# Patient Record
Sex: Male | Born: 1980 | Race: White | Hispanic: No | Smoking: Never smoker
Health system: Southern US, Community
[De-identification: ages and names within clinical notes are randomized; demographics above are authoritative.]

## PROBLEM LIST (undated history)

## (undated) DIAGNOSIS — F32A Depression, unspecified: Secondary | ICD-10-CM

## (undated) DIAGNOSIS — N200 Calculus of kidney: Secondary | ICD-10-CM

## (undated) DIAGNOSIS — F329 Major depressive disorder, single episode, unspecified: Secondary | ICD-10-CM

## (undated) HISTORY — PX: SKIN GRAFT: SHX250

---
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
## 2001-04-27 ENCOUNTER — Emergency Department (HOSPITAL_COMMUNITY): Admission: EM | Admit: 2001-04-27 | Discharge: 2001-04-27 | Payer: Self-pay

## 2001-04-27 ENCOUNTER — Encounter: Payer: Self-pay | Admitting: Emergency Medicine

## 2001-10-30 ENCOUNTER — Emergency Department (HOSPITAL_COMMUNITY): Admission: EM | Admit: 2001-10-30 | Discharge: 2001-10-30 | Payer: Self-pay | Admitting: Emergency Medicine

## 2001-10-30 ENCOUNTER — Encounter: Payer: Self-pay | Admitting: Emergency Medicine

## 2002-01-02 ENCOUNTER — Emergency Department (HOSPITAL_COMMUNITY): Admission: EM | Admit: 2002-01-02 | Discharge: 2002-01-02 | Payer: Self-pay | Admitting: Emergency Medicine

## 2002-01-02 ENCOUNTER — Encounter: Payer: Self-pay | Admitting: Emergency Medicine

## 2002-03-07 ENCOUNTER — Encounter: Payer: Self-pay | Admitting: Emergency Medicine

## 2002-03-07 ENCOUNTER — Emergency Department (HOSPITAL_COMMUNITY): Admission: EM | Admit: 2002-03-07 | Discharge: 2002-03-07 | Payer: Self-pay | Admitting: Emergency Medicine

## 2002-04-18 ENCOUNTER — Emergency Department (HOSPITAL_COMMUNITY): Admission: EM | Admit: 2002-04-18 | Discharge: 2002-04-18 | Payer: Self-pay | Admitting: Emergency Medicine

## 2002-06-12 ENCOUNTER — Ambulatory Visit (HOSPITAL_COMMUNITY): Admission: RE | Admit: 2002-06-12 | Discharge: 2002-06-12 | Payer: Self-pay

## 2003-01-06 ENCOUNTER — Emergency Department (HOSPITAL_COMMUNITY): Admission: EM | Admit: 2003-01-06 | Discharge: 2003-01-06 | Payer: Self-pay | Admitting: Emergency Medicine

## 2004-07-20 ENCOUNTER — Emergency Department (HOSPITAL_COMMUNITY): Admission: EM | Admit: 2004-07-20 | Discharge: 2004-07-20 | Payer: Self-pay | Admitting: Family Medicine

## 2004-09-04 ENCOUNTER — Emergency Department (HOSPITAL_COMMUNITY): Admission: EM | Admit: 2004-09-04 | Discharge: 2004-09-04 | Payer: Self-pay | Admitting: Emergency Medicine

## 2004-11-07 ENCOUNTER — Emergency Department (HOSPITAL_COMMUNITY): Admission: EM | Admit: 2004-11-07 | Discharge: 2004-11-07 | Payer: Self-pay | Admitting: Emergency Medicine

## 2006-03-03 ENCOUNTER — Emergency Department (HOSPITAL_COMMUNITY): Admission: EM | Admit: 2006-03-03 | Discharge: 2006-03-03 | Payer: Self-pay | Admitting: Emergency Medicine

## 2006-04-28 ENCOUNTER — Emergency Department (HOSPITAL_COMMUNITY): Admission: EM | Admit: 2006-04-28 | Discharge: 2006-04-28 | Payer: Self-pay | Admitting: Emergency Medicine

## 2014-05-28 ENCOUNTER — Emergency Department (HOSPITAL_COMMUNITY)
Admission: EM | Admit: 2014-05-28 | Discharge: 2014-05-29 | Disposition: A | Discharge status: Home / Self Care | Payer: Self-pay | Attending: Emergency Medicine | Admitting: Emergency Medicine

## 2014-05-28 ENCOUNTER — Encounter (HOSPITAL_COMMUNITY): Payer: Self-pay | Admitting: Emergency Medicine

## 2014-05-28 DIAGNOSIS — H578 Other specified disorders of eye and adnexa: Secondary | ICD-10-CM | POA: Insufficient documentation

## 2014-05-28 DIAGNOSIS — R509 Fever, unspecified: Secondary | ICD-10-CM

## 2014-05-28 DIAGNOSIS — R05 Cough: Secondary | ICD-10-CM

## 2014-05-28 DIAGNOSIS — J029 Acute pharyngitis, unspecified: Secondary | ICD-10-CM | POA: Insufficient documentation

## 2014-05-28 DIAGNOSIS — R059 Cough, unspecified: Secondary | ICD-10-CM

## 2014-05-28 DIAGNOSIS — R062 Wheezing: Secondary | ICD-10-CM | POA: Insufficient documentation

## 2014-05-28 DIAGNOSIS — Z87442 Personal history of urinary calculi: Secondary | ICD-10-CM | POA: Insufficient documentation

## 2014-05-28 HISTORY — DX: Calculus of kidney: N20.0

## 2014-05-28 MED ORDER — ALBUTEROL SULFATE (2.5 MG/3ML) 0.083% IN NEBU
5.0000 mg | INHALATION_SOLUTION | Freq: Once | RESPIRATORY_TRACT | Status: AC
  Administered 2014-05-28: 5 mg via RESPIRATORY_TRACT
  Filled 2014-05-28: qty 6

## 2014-05-28 MED ORDER — ACETAMINOPHEN 325 MG PO TABS
650.0000 mg | ORAL_TABLET | Freq: Once | ORAL | Status: AC
  Administered 2014-05-28: 650 mg via ORAL

## 2014-05-28 MED ORDER — IPRATROPIUM BROMIDE 0.02 % IN SOLN
0.5000 mg | Freq: Once | RESPIRATORY_TRACT | Status: AC
  Administered 2014-05-28: 0.5 mg via RESPIRATORY_TRACT
  Filled 2014-05-28: qty 2.5

## 2014-05-28 MED ORDER — ACETAMINOPHEN 325 MG PO TABS
325.0000 mg | ORAL_TABLET | Freq: Once | ORAL | Status: DC
  Filled 2014-05-28: qty 1

## 2014-05-28 NOTE — ED Provider Notes (Signed)
CSN: [id number]     Arrival date & time 05/28/14  2237 History   None    This chart was scribed for non-physician practitioner, Zacarias Pontes PA-C working with Kalman Drape, MD by Forrestine Him, ED Scribe. This patient was seen in room TR11C/TR11C and the patient's care was started at 11:13 PM.   No chief complaint on file.  Patient is a 33 y.o. male presenting with URI. The history is provided by the patient. No language interpreter was used.  URI Presenting symptoms: congestion (nasal), cough, fatigue, fever, rhinorrhea and sore throat   Presenting symptoms: no ear pain and no facial pain   Cough:    Cough characteristics:  Dry   Sputum characteristics:  Unable to specify   Severity:  Mild   Onset quality:  Gradual   Duration:  1 day   Timing:  Rare   Progression:  Unchanged   Chronicity:  New Fatigue:    Severity:  Moderate   Duration:  1 day   Timing:  Constant Fever:    Duration:  1 day   Timing:  Constant   Max temp PTA (F):  101   Temp source:  Subjective and oral   Progression:  Improving (after tylenol) Rhinorrhea:    Quality:  Clear   Severity:  Moderate   Duration:  1 day   Timing:  Constant   Progression:  Unchanged Sore throat:    Severity:  Severe   Onset quality:  Gradual   Duration:  1 day   Timing:  Constant   Progression:  Worsening Severity:  Moderate Onset quality:  Gradual Duration:  1 day Timing:  Constant Progression:  Worsening Chronicity:  New Relieved by:  Nothing Worsened by:  Breathing Ineffective treatments:  OTC medications Associated symptoms: myalgias, sinus pain and swollen glands   Associated symptoms: no arthralgias, no headaches, no neck pain, no sneezing and no wheezing   Myalgias:    Location:  Generalized   Severity:  Moderate   Onset quality:  Gradual   Duration:  1 day   Timing:  Constant   Progression:  Unchanged Risk factors: sick contacts     HPI Comments: Zachary Mack is a 33 y.o. healthy male who  presents to the Emergency Department complaining of constant, moderate, worsening sore throat x 1 day. Pt rates pain as 8/10, located in the back of his throat, nonradiating, exacerbated when swallowing and when air hits his throat, with no known alleviating factors at this time. Pt also reports rare dry cough due to the tickle in his throat, sinus pressure, clear rhinorrhea, fatigue, watery itchy eyes, fever of 101 at its highest, neck LAD, and myalgias. He has tried OTC Tylenol with mild improvement for symptoms. Denies CP, SOB, wheezing, stridor, trismus, drooling, inability to swallow, abd pain, nausea, vomiting, ear pain/drainage, dizziness, lightheadedness, throat swelling, arthralgias, paresthesias, or neck stiffness. +Sick contacts- Son was recently admitted to hospital for possible pneumonia. No known allergies to medications.  Past Medical History  Diagnosis Date  . Kidney stone    History reviewed. No pertinent past surgical history. No family history on file. History  Substance Use Topics  . Smoking status: Never Smoker   . Smokeless tobacco: Not on file  . Alcohol Use: No    Review of Systems  Constitutional: Positive for fever and fatigue.  HENT: Positive for congestion (nasal), rhinorrhea, sinus pressure and sore throat. Negative for drooling, ear discharge, ear pain, sneezing and trouble swallowing.  Eyes: Positive for itching. Negative for pain and discharge.  Respiratory: Positive for cough. Negative for chest tightness, shortness of breath and wheezing.   Cardiovascular: Negative for chest pain.  Gastrointestinal: Negative for nausea, vomiting, abdominal pain and diarrhea.  Musculoskeletal: Positive for myalgias. Negative for arthralgias, neck pain and neck stiffness.  Skin: Negative for color change and rash.  Neurological: Negative for dizziness, syncope, weakness, light-headedness, numbness and headaches.  Hematological: Positive for adenopathy.    A complete 10  system review of systems was obtained and all systems are negative except as noted in the HPI and PMH.    Allergies  Review of patient's allergies indicates no known allergies.  Home Medications   Prior to Admission medications   Not on File   Triage Vitals: BP 129/78 mmHg  Pulse 96  Temp(Src) 101.7 F (38.7 C) (Oral)  Resp 14  Ht $R'5\' 10"'lB$  (1.778 m)  Wt 153 lb (69.4 kg)  BMI 21.95 kg/m2  SpO2 98%   Physical Exam  Constitutional: He is oriented to person, place, and time. He appears well-developed and well-nourished.  Non-toxic appearance. He appears distressed.  Febrile initially which resolved with tylenol. Otherwise vital signs stable. Patient appears to be uncomfortable, but is nontoxic appearing.  HENT:  Head: Normocephalic and atraumatic.  Right Ear: Hearing, tympanic membrane, external ear and ear canal normal.  Left Ear: Hearing, tympanic membrane, external ear and ear canal normal.  Nose: Mucosal edema and rhinorrhea present. Right sinus exhibits frontal sinus tenderness. Left sinus exhibits frontal sinus tenderness.  Mouth/Throat: Uvula is midline and mucous membranes are normal. No trismus in the jaw. No uvula swelling. Oropharyngeal exudate and posterior oropharyngeal erythema present. No tonsillar abscesses.  Ears clear bilaterally. Mild bilateral mucosal edema with clear rhinorrhea and frontal sinus tenderness to palpation. Oropharynx erythematous with 1-2+  tonsillar swelling bilaterally with mild exudates. No deviation of uvula or swelling. No drooling or trismus. Airway intact. No evidence of peritonsillar abscess.  Eyes: Conjunctivae and EOM are normal. Pupils are equal, round, and reactive to light. Right eye exhibits no discharge. Left eye exhibits no discharge.  Neck: Normal range of motion. Neck supple.  Cardiovascular: Normal rate, regular rhythm and intact distal pulses.  Exam reveals no gallop and no friction rub.   No murmur heard. Pulmonary/Chest: Effort  normal. No respiratory distress. He has no decreased breath sounds. He has wheezes in the right lower field. He has no rhonchi. He has no rales. He exhibits no tenderness.  Mild end expiratory wheeze in the R lower field which resolved with nebulizer treatment, no rhonchi or rales. Breath sounds equal bilaterally. No increased work of breathing. Oxygenation 98% on room air.  Abdominal: Soft. Normal appearance and bowel sounds are normal. He exhibits no distension. There is no tenderness. There is no rigidity, no rebound and no guarding.  Musculoskeletal: Normal range of motion.  MAE x4  Lymphadenopathy:       Head (right side): Tonsillar adenopathy present.       Head (left side): Tonsillar adenopathy present.    He has cervical adenopathy.  Superficial cervical and tonsillar lymphadenopathy bilaterally, which is tender to palpation  Neurological: He is alert and oriented to person, place, and time. He has normal strength. No sensory deficit.  Skin: Skin is warm, dry and intact. No rash noted.  Psychiatric: He has a normal mood and affect.  Nursing note and vitals reviewed.   ED Course  Procedures (including critical care time)  DIAGNOSTIC STUDIES: Oxygen  Saturation is 98% on RA, Normal by my interpretation.    COORDINATION OF CARE: 11:12 PM- Will give Tylenol, Atrovent, and Proventil. Will order DG Chest 2 view. Discussed treatment plan with pt at bedside and pt agreed to plan.     Labs Review Labs Reviewed - No data to display  Imaging Review Dg Chest 2 View  05/29/2014   CLINICAL DATA:  Cough and fever for 2 days  EXAM: CHEST  2 VIEW  COMPARISON:  09/04/2004  FINDINGS: Cardiac shadow is within normal limits. 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 The lungs are well-aerated without focal infiltrate or sizable effusion. No bony abnormality is noted.  IMPRESSION: No active cardiopulmonary disease.   Electronically Signed   By: Inez Catalina M.D.   On: 05/29/2014 00:18     EKG  Interpretation None      MDM   Final diagnoses:  Cough  Pharyngitis  Wheezing  Fever, unspecified fever cause    32y/o male with pharyngitis, fever, and body aches. CENTOR criteria met therefore will proceed with tx for strep without testing. Pt opted for bicillin shot here. Fever brought down with tylenol. Wheezing in RLL which resolved after nebs. CXR neg for PNA. Doubt PTA since tonsils are swollen equally, and pt handling secretions without difficulty. Discussed symptomatic care for URI symptoms, and use of inhaler for wheezing. No prednisone given since pt has no hx of asthma/COPD. Given rx for pain meds, but discussed tylenol/motrin as first line for fever/pain control. Pt doesn't have a PCP but his mother states she will get him in with someone in next 3-5 days. I explained the diagnosis and have given explicit precautions to return to the ER including for any other new or worsening symptoms. The patient understands and accepts the medical plan as it's been dictated and I have answered their questions. Discharge instructions concerning home care and prescriptions have been given. The patient is STABLE and is discharged to home in good condition.   I personally performed the services described in this documentation, which was scribed in my presence. The recorded information has been reviewed and is accurate.   BP 133/69 mmHg  Pulse 97  Temp(Src) 98.8 F (37.1 C) (Oral)  Resp 14  Ht $R'5\' 10"'Hm$  (1.778 m)  Wt 153 lb (69.4 kg)  BMI 21.95 kg/m2  SpO2 97%  Meds ordered this encounter  Medications  . DISCONTD: acetaminophen (TYLENOL) tablet 325 mg    Sig:   . acetaminophen (TYLENOL) tablet 650 mg    Sig:   . ipratropium (ATROVENT) nebulizer solution 0.5 mg    Sig:   . albuterol (PROVENTIL) (2.5 MG/3ML) 0.083% nebulizer solution 5 mg    Sig:   . penicillin g benzathine (BICILLIN LA) 1200000 UNIT/2ML injection 1.2 Million Units    Sig:     Order Specific Question:  Antibiotic  Indication:    Answer:  Pharyngitis  . naproxen (NAPROSYN) 500 MG tablet    Sig: Take 1 tablet (500 mg total) by mouth 2 (two) times daily as needed for mild pain, moderate pain or headache (TAKE WITH MEALS.).    Dispense:  20 tablet    Refill:  0    Order Specific Question:  Supervising Provider    Answer:  Noemi Chapel D [[id number]]  . HYDROcodone-acetaminophen (NORCO) 5-325 MG per tablet    Sig: Take 1-2 tablets by mouth every 6 (six) hours as needed for severe pain.    Dispense:  6 tablet  Refill:  0    Order Specific Question:  Supervising Provider    Answer:  Noemi Chapel D [[id number]]  . albuterol (PROVENTIL HFA;VENTOLIN HFA) 108 (90 BASE) MCG/ACT inhaler    Sig: Inhale 2 puffs into the lungs every 2 (two) hours as needed for wheezing or shortness of breath (cough).    Dispense:  1 Inhaler    Refill:  0    Order Specific Question:  Supervising Provider    Answer:  Noemi Chapel D [[id number]]  . Spacer/Aero-Holding Chambers (AEROCHAMBER PLUS WITH MASK) inhaler    Sig: Use as instructed    Dispense:  1 each    Refill:  2    Order Specific Question:  Supervising Provider    Answer:  Johnna Acosta [address] [place], PA-C 05/29/14 0201  Kalman Drape, MD 05/29/14 [phone]

## 2014-05-28 NOTE — ED Notes (Signed)
Pt. reports sore throat with occasional dry cough , fever and body aches . Airway intact / respirations unlabored .

## 2014-05-29 ENCOUNTER — Emergency Department (HOSPITAL_COMMUNITY): Payer: Self-pay

## 2014-05-29 MED ORDER — AEROCHAMBER PLUS W/MASK MISC: Status: DC

## 2014-05-29 MED ORDER — NAPROXEN 500 MG PO TABS: 500.0000 mg | ORAL_TABLET | Freq: Two times a day (BID) | ORAL | Status: DC | PRN

## 2014-05-29 MED ORDER — PENICILLIN G BENZATHINE 1200000 UNIT/2ML IM SUSP
1.2000 10*6.[IU] | Freq: Once | INTRAMUSCULAR | Status: AC
  Administered 2014-05-29: 1.2 10*6.[IU] via INTRAMUSCULAR
  Filled 2014-05-29: qty 2

## 2014-05-29 MED ORDER — HYDROCODONE-ACETAMINOPHEN 5-325 MG PO TABS: 1.0000 | ORAL_TABLET | Freq: Four times a day (QID) | ORAL | Status: DC | PRN

## 2014-05-29 MED ORDER — ALBUTEROL SULFATE HFA 108 (90 BASE) MCG/ACT IN AERS: 2.0000 | INHALATION_SPRAY | RESPIRATORY_TRACT | Status: DC | PRN

## 2014-05-29 NOTE — Discharge Instructions (Signed)
Continue to stay well-hydrated. Gargle warm salt water and spit it out. Consider chloraseptic spray or lozenges for throat pain. Continue to alternate between Tylenol and Ibuprofen for pain or fever. Use over the counter netipot and flonase to help with nasal congestion. May consider over-the-counter Benadryl or other antihistamine to decrease secretions and for watery itchy eyes. Followup with a primary care doctor in 3-5 days for recheck of ongoing symptoms, use the list below if you don't have a regular doctor. Return to emergency department for emergent changing or worsening of symptoms.  Use inhaler as directed, as needed for cough/chest congestion.   Pharyngitis Pharyngitis is redness, pain, and swelling (inflammation) of your pharynx.  CAUSES  Pharyngitis is usually caused by infection. Most of the time, these infections are from viruses (viral) and are part of a cold. However, sometimes pharyngitis is caused by bacteria (bacterial). Pharyngitis can also be caused by allergies. Viral pharyngitis may be spread from person to person by coughing, sneezing, and personal items or utensils (cups, forks, spoons, toothbrushes). Bacterial pharyngitis may be spread from person to person by more intimate contact, such as kissing.  SIGNS AND SYMPTOMS  Symptoms of pharyngitis include:   Sore throat.   Tiredness (fatigue).   Low-grade fever.   Headache.  Joint pain and muscle aches.  Skin rashes.  Swollen lymph nodes.  Plaque-like film on throat or tonsils (often seen with bacterial pharyngitis). DIAGNOSIS  Your health care provider will ask you questions about your illness and your symptoms. Your medical history, along with a physical exam, is often all that is needed to diagnose pharyngitis. Sometimes, a rapid strep test is done. Other lab tests may also be done, depending on the suspected cause.  TREATMENT  Viral pharyngitis will usually get better in 3-4 days without the use of  medicine. Bacterial pharyngitis is treated with medicines that kill germs (antibiotics).  HOME CARE INSTRUCTIONS   Drink enough water and fluids to keep your urine clear or pale yellow.   Only take over-the-counter or prescription medicines as directed by your health care provider:   If you are prescribed antibiotics, make sure you finish them even if you start to feel better.   Do not take aspirin.   Get lots of rest.   Gargle with 8 oz of salt water ( tsp of salt per 1 qt of water) as often as every 1-2 hours to soothe your throat.   Throat lozenges (if you are not at risk for choking) or sprays may be used to soothe your throat. SEEK MEDICAL CARE IF:   You have large, tender lumps in your neck.  You have a rash.  You cough up green, yellow-brown, or bloody spit. SEEK IMMEDIATE MEDICAL CARE IF:   Your neck becomes stiff.  You drool or are unable to swallow liquids.  You vomit or are unable to keep medicines or liquids down.  You have severe pain that does not go away with the use of recommended medicines.  You have trouble breathing (not caused by a stuffy nose). MAKE SURE YOU:   Understand these instructions.  Will watch your condition.  Will get help right away if you are not doing well or get worse. Document Released: 06/27/2005 Document Revised: 04/17/2013 Document Reviewed: 03/04/2013 [facility] Patient Information 2015 [place], [place]. This information is not intended to replace advice given to you by your health care provider. Make sure you discuss any questions you have with your health care provider.  Cough, Adult  A cough is a reflex. It helps you clear your throat and airways. A cough can help heal your body. A cough can last 2 or 3 weeks (acute) or may last more than 8 weeks (chronic). Some common causes of a cough can include an infection, allergy, or a cold. HOME CARE  Only take medicine as told by your doctor.  If given, take your medicines  (antibiotics) as told. Finish them even if you start to feel better.  Use a cold steam vaporizer or humidifier in your home. This can help loosen thick spit (secretions).  Sleep so you are almost sitting up (semi-upright). Use pillows to do this. This helps reduce coughing.  Rest as needed.  Stop smoking if you smoke. GET HELP RIGHT AWAY IF:  You have yellowish-white fluid (pus) in your thick spit.  Your cough gets worse.  Your medicine does not reduce coughing, and you are losing sleep.  You cough up blood.  You have trouble breathing.  Your pain gets worse and medicine does not help.  You have a fever. MAKE SURE YOU:   Understand these instructions.  Will watch your condition.  Will get help right away if you are not doing well or get worse. Document Released: 03/10/2011 Document Revised: 11/11/2013 Document Reviewed: 03/10/2011 [facility] Patient Information 2015 [place], [place]. This information is not intended to replace advice given to you by your health care provider. Make sure you discuss any questions you have with your health care provider.  How to Use an Inhaler Proper inhaler technique is very important. Good technique ensures that the medicine reaches the lungs. Poor technique results in depositing the medicine on the tongue and back of the throat rather than in the airways. If you do not use the inhaler with good technique, the medicine will not help you. STEPS TO FOLLOW IF USING AN INHALER WITHOUT AN EXTENSION TUBE  Remove the cap from the inhaler.  If you are using the inhaler for the first time, you will need to prime it. Shake the inhaler for 5 seconds and release four puffs into the air, away from your face. Ask your health care provider or pharmacist if you have questions about priming your inhaler.  Shake the inhaler for 5 seconds before each breath in (inhalation).  Position the inhaler so that the top of the canister faces up.  Put your index finger  on the top of the medicine canister. Your thumb supports the bottom of the inhaler.  Open your mouth.  Either place the inhaler between your teeth and place your lips tightly around the mouthpiece, or hold the inhaler 1-2 inches away from your open mouth. If you are unsure of which technique to use, ask your health care provider.  Breathe out (exhale) normally and as completely as possible.  Press the canister down with your index finger to release the medicine.  At the same time as the canister is pressed, inhale deeply and slowly until your lungs are completely filled. This should take 4-6 seconds. Keep your tongue down.  Hold the medicine in your lungs for 5-10 seconds (10 seconds is best). This helps the medicine get into the small airways of your lungs.  Breathe out slowly, through pursed lips. Whistling is an example of pursed lips.  Wait at least 15-30 seconds between puffs. Continue with the above steps until you have taken the number of puffs your health care provider has ordered. Do not use the inhaler more than your health care  provider tells you.  Replace the cap on the inhaler.  Follow the directions from your health care provider or the inhaler insert for cleaning the inhaler. STEPS TO FOLLOW IF USING AN INHALER WITH AN EXTENSION (SPACER)  Remove the cap from the inhaler.  If you are using the inhaler for the first time, you will need to prime it. Shake the inhaler for 5 seconds and release four puffs into the air, away from your face. Ask your health care provider or pharmacist if you have questions about priming your inhaler.  Shake the inhaler for 5 seconds before each breath in (inhalation).  Place the open end of the spacer onto the mouthpiece of the inhaler.  Position the inhaler so that the top of the canister faces up and the spacer mouthpiece faces you.  Put your index finger on the top of the medicine canister. Your thumb supports the bottom of the inhaler and  the spacer.  Breathe out (exhale) normally and as completely as possible.  Immediately after exhaling, place the spacer between your teeth and into your mouth. Close your lips tightly around the spacer.  Press the canister down with your index finger to release the medicine.  At the same time as the canister is pressed, inhale deeply and slowly until your lungs are completely filled. This should take 4-6 seconds. Keep your tongue down and out of the way.  Hold the medicine in your lungs for 5-10 seconds (10 seconds is best). This helps the medicine get into the small airways of your lungs. Exhale.  Repeat inhaling deeply through the spacer mouthpiece. Again hold that breath for up to 10 seconds (10 seconds is best). Exhale slowly. If it is difficult to take this second deep breath through the spacer, breathe normally several times through the spacer. Remove the spacer from your mouth.  Wait at least 15-30 seconds between puffs. Continue with the above steps until you have taken the number of puffs your health care provider has ordered. Do not use the inhaler more than your health care provider tells you.  Remove the spacer from the inhaler, and place the cap on the inhaler.  Follow the directions from your health care provider or the inhaler insert for cleaning the inhaler and spacer. If you are using different kinds of inhalers, use your quick relief medicine to open the airways 10-15 minutes before using a steroid if instructed to do so by your health care provider. If you are unsure which inhalers to use and the order of using them, ask your health care provider, nurse, or respiratory therapist. If you are using a steroid inhaler, always rinse your mouth with water after your last puff, then gargle and spit out the water. Do not swallow the water. AVOID:  Inhaling before or after starting the spray of medicine. It takes practice to coordinate your breathing with triggering the  spray.  Inhaling through the nose (rather than the mouth) when triggering the spray. HOW TO DETERMINE IF YOUR INHALER IS FULL OR NEARLY EMPTY You cannot know when an inhaler is empty by shaking it. A few inhalers are now being made with dose counters. Ask your health care provider for a prescription that has a dose counter if you feel you need that extra help. If your inhaler does not have a counter, ask your health care provider to help you determine the date you need to refill your inhaler. Write the refill date on a calendar or your inhaler canister.  Refill your inhaler 7-10 days before it runs out. Be sure to keep an adequate supply of medicine. This includes making sure it is not expired, and that you have a spare inhaler.  SEEK MEDICAL CARE IF:   Your symptoms are only partially relieved with your inhaler.  You are having trouble using your inhaler.  You have some increase in phlegm. SEEK IMMEDIATE MEDICAL CARE IF:   You feel little or no relief with your inhalers. You are still wheezing and are feeling shortness of breath or tightness in your chest or both.  You have dizziness, headaches, or a fast heart rate.  You have chills, fever, or night sweats.  You have a noticeable increase in phlegm production, or there is blood in the phlegm. MAKE SURE YOU:   Understand these instructions.  Will watch your condition.  Will get help right away if you are not doing well or get worse. Document Released: 06/24/2000 Document Revised: 04/17/2013 Document Reviewed: 01/24/2013 [facility] Patient Information 2015 [place]. This information is not intended to replace advice given to you by your health care provider. Make sure you discuss any questions you have with your health care provider.  Strep Throat Strep throat is an infection of the throat caused by a bacteria named Streptococcus pyogenes. Your health care provider may call the infection streptococcal "tonsillitis" or  "pharyngitis" depending on whether there are signs of inflammation in the tonsils or back of the throat. Strep throat is most common in children aged 5-15 years during the cold months of the year, but it can occur in people of any age during any season. This infection is spread from person to person (contagious) through coughing, sneezing, or other close contact. SIGNS AND SYMPTOMS   Fever or chills.  Painful, swollen, red tonsils or throat.  Pain or difficulty when swallowing.  White or yellow spots on the tonsils or throat.  Swollen, tender lymph nodes or "glands" of the neck or under the jaw.  Red rash all over the body (rare). DIAGNOSIS  Many different infections can cause the same symptoms. A test must be done to confirm the diagnosis so the right treatment can be given. A "rapid strep test" can help your health care provider make the diagnosis in a few minutes. If this test is not available, a light swab of the infected area can be used for a throat culture test. If a throat culture test is done, results are usually available in a day or two. TREATMENT  Strep throat is treated with antibiotic medicine. HOME CARE INSTRUCTIONS   Gargle with 1 tsp of salt in 1 cup of warm water, 3-4 times per day or as needed for comfort.  Family members who also have a sore throat or fever should be tested for strep throat and treated with antibiotics if they have the strep infection.  Make sure everyone in your household washes their hands well.  Do not share food, drinking cups, or personal items that could cause the infection to spread to others.  You may need to eat a soft food diet until your sore throat gets better.  Drink enough water and fluids to keep your urine clear or pale yellow. This will help prevent dehydration.  Get plenty of rest.  Stay home from school, day care, or work until you have been on antibiotics for 24 hours.  Take medicines only as directed by your health care  provider.  Take your antibiotic medicine as directed by your health  care provider. Finish it even if you start to feel better. SEEK MEDICAL CARE IF:   The glands in your neck continue to enlarge.  You develop a rash, cough, or earache.  You cough up green, yellow-brown, or bloody sputum.  You have pain or discomfort not controlled by medicines.  Your problems seem to be getting worse rather than better.  You have a fever. SEEK IMMEDIATE MEDICAL CARE IF:   You develop any new symptoms such as vomiting, severe headache, stiff or painful neck, chest pain, shortness of breath, or trouble swallowing.  You develop severe throat pain, drooling, or changes in your voice.  You develop swelling of the neck, or the skin on the neck becomes red and tender.  You develop signs of dehydration, such as fatigue, dry mouth, and decreased urination.  You become increasingly sleepy, or you cannot wake up completely. MAKE SURE YOU:  Understand these instructions.  Will watch your condition.  Will get help right away if you are not doing well or get worse. Document Released: 06/24/2000 Document Revised: 11/11/2013 Document Reviewed: 08/26/2010 [facility] Patient Information 2015 [place], [place]. This information is not intended to replace advice given to you by your health care provider. Make sure you discuss any questions you have with your health care provider.  Salt Water Gargle This solution will help make your mouth and throat feel better. HOME CARE INSTRUCTIONS   Mix 1 teaspoon of salt in 8 ounces of warm water.  Gargle with this solution as much or often as you need or as directed. Swish and gargle gently if you have any sores or wounds in your mouth.  Do not swallow this mixture. Document Released: 03/31/2004 Document Revised: 09/19/2011 Document Reviewed: 08/22/2008 [facility] Patient Information 2015 [place], [place]. This information is not intended to replace advice given to you by  your health care provider. Make sure you discuss any questions you have with your health care provider.

## 2014-05-29 NOTE — ED Notes (Signed)
Pt A&OX4, ambulatory at d/c with steady gait, NAD 

## 2014-09-23 ENCOUNTER — Encounter (HOSPITAL_COMMUNITY): Payer: Self-pay | Admitting: Emergency Medicine

## 2014-09-23 ENCOUNTER — Emergency Department (HOSPITAL_COMMUNITY): Payer: Self-pay

## 2014-09-23 ENCOUNTER — Emergency Department (HOSPITAL_COMMUNITY)
Admission: EM | Admit: 2014-09-23 | Discharge: 2014-09-23 | Disposition: A | Discharge status: Home / Self Care | Payer: Self-pay | Attending: Emergency Medicine | Admitting: Emergency Medicine

## 2014-09-23 DIAGNOSIS — Y9389 Activity, other specified: Secondary | ICD-10-CM | POA: Insufficient documentation

## 2014-09-23 DIAGNOSIS — Z79899 Other long term (current) drug therapy: Secondary | ICD-10-CM | POA: Insufficient documentation

## 2014-09-23 DIAGNOSIS — R519 Headache, unspecified: Secondary | ICD-10-CM

## 2014-09-23 DIAGNOSIS — S20312A Abrasion of left front wall of thorax, initial encounter: Secondary | ICD-10-CM | POA: Insufficient documentation

## 2014-09-23 DIAGNOSIS — S0990XA Unspecified injury of head, initial encounter: Secondary | ICD-10-CM | POA: Insufficient documentation

## 2014-09-23 DIAGNOSIS — Z791 Long term (current) use of non-steroidal anti-inflammatories (NSAID): Secondary | ICD-10-CM | POA: Insufficient documentation

## 2014-09-23 DIAGNOSIS — R0789 Other chest pain: Secondary | ICD-10-CM

## 2014-09-23 DIAGNOSIS — S20211A Contusion of right front wall of thorax, initial encounter: Secondary | ICD-10-CM | POA: Insufficient documentation

## 2014-09-23 DIAGNOSIS — Z87442 Personal history of urinary calculi: Secondary | ICD-10-CM | POA: Insufficient documentation

## 2014-09-23 DIAGNOSIS — Y998 Other external cause status: Secondary | ICD-10-CM | POA: Insufficient documentation

## 2014-09-23 DIAGNOSIS — R51 Headache: Secondary | ICD-10-CM

## 2014-09-23 DIAGNOSIS — Y92410 Unspecified street and highway as the place of occurrence of the external cause: Secondary | ICD-10-CM | POA: Insufficient documentation

## 2014-09-23 MED ORDER — METHOCARBAMOL 500 MG PO TABS: 500.0000 mg | ORAL_TABLET | Freq: Four times a day (QID) | ORAL | Status: DC

## 2014-09-23 MED ORDER — OXYCODONE-ACETAMINOPHEN 5-325 MG PO TABS: 1.0000 | ORAL_TABLET | ORAL | Status: DC | PRN
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
## 2014-09-23 NOTE — Discharge Instructions (Signed)
Take the prescribed medication as directed. °Return to the ED for new or worsening symptoms. ° °

## 2015-06-29 ENCOUNTER — Encounter (HOSPITAL_COMMUNITY): Payer: Self-pay | Admitting: *Deleted

## 2015-06-29 ENCOUNTER — Emergency Department (HOSPITAL_COMMUNITY): Payer: Self-pay

## 2015-06-29 ENCOUNTER — Emergency Department (HOSPITAL_COMMUNITY)
Admission: EM | Admit: 2015-06-29 | Discharge: 2015-06-29 | Disposition: A | Discharge status: Home / Self Care | Payer: Self-pay | Attending: Emergency Medicine | Admitting: Emergency Medicine

## 2015-06-29 DIAGNOSIS — Y998 Other external cause status: Secondary | ICD-10-CM | POA: Insufficient documentation

## 2015-06-29 DIAGNOSIS — Y92310 Basketball court as the place of occurrence of the external cause: Secondary | ICD-10-CM | POA: Insufficient documentation

## 2015-06-29 DIAGNOSIS — X509XXA Other and unspecified overexertion or strenuous movements or postures, initial encounter: Secondary | ICD-10-CM | POA: Insufficient documentation

## 2015-06-29 DIAGNOSIS — Y9367 Activity, basketball: Secondary | ICD-10-CM | POA: Insufficient documentation

## 2015-06-29 DIAGNOSIS — S99912A Unspecified injury of left ankle, initial encounter: Secondary | ICD-10-CM

## 2015-06-29 DIAGNOSIS — S9002XA Contusion of left ankle, initial encounter: Secondary | ICD-10-CM | POA: Insufficient documentation

## 2015-06-29 MED ORDER — NAPROXEN 500 MG PO TABS: 500.0000 mg | ORAL_TABLET | Freq: Two times a day (BID) | ORAL | Status: DC

## 2015-06-29 NOTE — ED Provider Notes (Signed)
CSN: [id number]     Arrival date & time 06/29/15  1906 History   First MD Initiated Contact with Patient 06/29/15 2007     Chief Complaint  Patient presents with  . Ankle Injury     (Consider location/radiation/quality/duration/timing/severity/associated sxs/prior Treatment) Patient is a 34 y.o. male presenting with lower extremity injury. The history is provided by the patient and medical records.  Ankle Injury Associated symptoms include arthralgias.     34 year old male with history of kidney stones, presenting to the ED for left ankle injury. Patient was playing basketball earlier this morning when he landed awkwardly with his left foot twisted behind his body. He denies any head injury or loss of consciousness. He complains of lateral left ankle pain and pain of dorsal left foot. Pain is exacerbated with weightbearing and ambulation. He denies any numbness or weakness of his left foot. Patient has made ambulatory, but states he is limping due to pain. No prior left ankle injuries.  He did have a skin graft of his left foot as a child.  Past Medical History  Diagnosis Date  . Kidney stone    History reviewed. No pertinent past surgical history. No family history on file. Social History  Substance Use Topics  . Smoking status: Never Smoker   . Smokeless tobacco: None  . Alcohol Use: No    Review of Systems  Musculoskeletal: Positive for arthralgias.  All other systems reviewed and are negative.     Allergies  Review of patient's allergies indicates no known allergies.  Home Medications   Prior to Admission medications   Medication Sig Start Date End Date Taking? Authorizing Provider  acetaminophen (TYLENOL) 500 MG tablet Take 1,000 mg by mouth every 4 (four) hours as needed for mild pain.  09/10/14   Historical Provider, MD  albuterol (PROVENTIL HFA;VENTOLIN HFA) 108 (90 BASE) MCG/ACT inhaler Inhale 2 puffs into the lungs every 2 (two) hours as needed for wheezing or  shortness of breath (cough). 05/29/14   Mercedes Camprubi-Soms, PA-C  HYDROcodone-acetaminophen (NORCO) 5-325 MG per tablet Take 1-2 tablets by mouth every 6 (six) hours as needed for severe pain. Patient not taking: Reported on 09/23/2014 05/29/14   Mercedes Camprubi-Soms, PA-C  ibuprofen (ADVIL,MOTRIN) 800 MG tablet Take 800 mg by mouth 3 (three) times daily. 09/10/14   Historical Provider, MD  methocarbamol (ROBAXIN) 500 MG tablet Take 1 tablet (500 mg total) by mouth 4 (four) times daily. 09/23/14   Garlon Hatchet, PA-C  naproxen (NAPROSYN) 500 MG tablet Take 1 tablet (500 mg total) by mouth 2 (two) times daily as needed for mild pain, moderate pain or headache (TAKE WITH MEALS.). Patient not taking: Reported on 09/23/2014 05/29/14   Mercedes Camprubi-Soms, PA-C  oxyCODONE-acetaminophen (PERCOCET/ROXICET) 5-325 MG per tablet Take 1 tablet by mouth every 4 (four) hours as needed. 09/23/14   Garlon Hatchet, PA-C   BP 133/89 mmHg  Pulse 70  Temp(Src) 98.3 F (36.8 C) (Oral)  Resp 18  Wt 72.576 kg  SpO2 100%   Physical Exam  Constitutional: He is oriented to person, place, and time. He appears well-developed and well-nourished. No distress.  HENT:  Head: Normocephalic and atraumatic.  Mouth/Throat: Oropharynx is clear and moist.  Eyes: Conjunctivae and EOM are normal. Pupils are equal, round, and reactive to light.  Neck: Normal range of motion. Neck supple.  Cardiovascular: Normal rate, regular rhythm and normal heart sounds.   Pulmonary/Chest: Effort normal and breath sounds normal. No respiratory distress. He has  no wheezes.  Musculoskeletal: Normal range of motion.       Left ankle: He exhibits ecchymosis. He exhibits normal range of motion, no swelling, no deformity, no laceration and normal pulse. Tenderness. Lateral malleolus tenderness found. Achilles tendon normal.       Left foot: There is tenderness and bony tenderness. There is normal range of motion, no swelling, normal capillary  refill, no crepitus, no deformity and no laceration.       Feet:  TTP of left lateral malleolus and dorsal/lateral left foot; mild bruising noted; mild swelling noted to ankle without bony deformity; full ROM maintained, pain noted with flexion of ankle; moving all toes appropriately; DP pulse intact; normal sensation throughout  Neurological: He is alert and oriented to person, place, and time.  Skin: Skin is warm and dry. He is not diaphoretic.  Psychiatric: He has a normal mood and affect.  Nursing note and vitals reviewed.   ED Course  Procedures (including critical care time) Labs Review Labs Reviewed - No data to display  Imaging Review Dg Ankle Complete Left  06/29/2015  CLINICAL DATA:  34 year old male status post injury playing basketball this morning with dorsal foot pain radiating to the lower ankle. Initial encounter. EXAM: LEFT ANKLE COMPLETE - 3+ VIEW COMPARISON:  Left foot series from today reported separately. FINDINGS: Bone mineralization is within normal limits. Mortise joint alignment preserved. Talar dome intact. No definite joint effusion. There does appear to be anterior soft tissue swelling. Calcaneus appears intact. Distal tibia and fibula appear intact. IMPRESSION: Anterior soft tissue swelling. No acute fracture or dislocation identified about the left ankle. Electronically Signed   By: Odessa FlemingH  Hall M.D.   On: 06/29/2015 20:01   Dg Foot Complete Left  06/29/2015  CLINICAL DATA:  34 year old male status post injury playing basketball this morning with dorsal foot pain radiating to the lower ankle. Initial encounter. EXAM: LEFT FOOT - COMPLETE 3+ VIEW COMPARISON:  None. FINDINGS: Bone mineralization is within normal limits. Pes planus. Calcaneus intact. Tarsal joint spaces and alignment appear preserved. Metatarsals and phalanges appear intact. There is mild blunting of the head of the second metatarsal which appears chronic, and there is probably a healed deformity at the  medial base of the second proximal phalanx. No acute fracture or dislocation identified. IMPRESSION: No acute fracture or dislocation identified about the left foot. Electronically Signed   By: Odessa FlemingH  Hall M.D.   On: 06/29/2015 20:00   I have personally reviewed and evaluated these images and lab results as part of my medical decision-making.   EKG Interpretation None      MDM   Final diagnoses:  Left ankle injury, initial encounter   34 year old male here with left ankle injury this morning while playing basketball. No bony deformities noted on exam. There is mild swelling and bruising. Left foot remains neurovascularly intact. Patient is ambulatory but with some pain. X-rays negative for acute bony findings. Patient placed in ASO brace and given crutches. Instructed to progress back to full weightbearing as tolerated. Given orthopedic follow-up if no improvement in the next week.  Rx naprosyn, RICE routine.  Discussed plan with patient, he/she acknowledged understanding and agreed with plan of care.  Return precautions given for new or worsening symptoms.  Garlon HatchetLisa M Diana Davenport, PA-C 06/29/15 2154  Lorre NickAnthony Allen, MD 06/29/15 [phone]

## 2015-06-29 NOTE — ED Notes (Signed)
Pt states that he was playing basketball this morning and landed wrong on his left foot; pt states that he rolled over his left ankle; pt c/o left ankle and top of foot pain; pt states pain is worse when weight is applied; + pulse ; + sensation

## 2015-06-29 NOTE — Discharge Instructions (Signed)
Take the prescribed medication as directed.  Ice and elevate ankle at home to help with pain/swelling. Use crutches and progress back to full weight bearing as tolerated. Follow-up with Dr. Roda ShuttersXu if no improvement within the next week. Return to the ED for new or worsening symptoms.

## 2015-09-17 ENCOUNTER — Encounter (HOSPITAL_COMMUNITY): Payer: Self-pay | Admitting: Emergency Medicine

## 2015-09-17 ENCOUNTER — Emergency Department (HOSPITAL_COMMUNITY)
Admission: EM | Admit: 2015-09-17 | Discharge: 2015-09-17 | Disposition: A | Discharge status: Home / Self Care | Payer: Self-pay | Attending: Emergency Medicine | Admitting: Emergency Medicine

## 2015-09-17 DIAGNOSIS — Z791 Long term (current) use of non-steroidal anti-inflammatories (NSAID): Secondary | ICD-10-CM | POA: Insufficient documentation

## 2015-09-17 DIAGNOSIS — Y998 Other external cause status: Secondary | ICD-10-CM | POA: Insufficient documentation

## 2015-09-17 DIAGNOSIS — S29011A Strain of muscle and tendon of front wall of thorax, initial encounter: Secondary | ICD-10-CM | POA: Insufficient documentation

## 2015-09-17 DIAGNOSIS — Y9389 Activity, other specified: Secondary | ICD-10-CM | POA: Insufficient documentation

## 2015-09-17 DIAGNOSIS — Y9289 Other specified places as the place of occurrence of the external cause: Secondary | ICD-10-CM | POA: Insufficient documentation

## 2015-09-17 DIAGNOSIS — Z87442 Personal history of urinary calculi: Secondary | ICD-10-CM | POA: Insufficient documentation

## 2015-09-17 DIAGNOSIS — X58XXXA Exposure to other specified factors, initial encounter: Secondary | ICD-10-CM | POA: Insufficient documentation

## 2015-09-17 DIAGNOSIS — Z79899 Other long term (current) drug therapy: Secondary | ICD-10-CM | POA: Insufficient documentation

## 2015-09-17 MED ORDER — MELOXICAM 7.5 MG PO TABS: 15.0000 mg | ORAL_TABLET | Freq: Every day | ORAL | Status: DC

## 2015-09-17 NOTE — Discharge Instructions (Signed)
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
## 2015-12-07 ENCOUNTER — Emergency Department (HOSPITAL_COMMUNITY)
Admission: EM | Admit: 2015-12-07 | Discharge: 2015-12-07 | Disposition: A | Discharge status: Home / Self Care | Payer: No Typology Code available for payment source | Attending: Emergency Medicine | Admitting: Emergency Medicine

## 2015-12-07 ENCOUNTER — Encounter (HOSPITAL_COMMUNITY): Payer: Self-pay | Admitting: Emergency Medicine

## 2015-12-07 DIAGNOSIS — Z791 Long term (current) use of non-steroidal anti-inflammatories (NSAID): Secondary | ICD-10-CM | POA: Insufficient documentation

## 2015-12-07 DIAGNOSIS — G8929 Other chronic pain: Secondary | ICD-10-CM | POA: Insufficient documentation

## 2015-12-07 DIAGNOSIS — K0889 Other specified disorders of teeth and supporting structures: Secondary | ICD-10-CM | POA: Insufficient documentation

## 2015-12-07 DIAGNOSIS — Z79891 Long term (current) use of opiate analgesic: Secondary | ICD-10-CM | POA: Insufficient documentation

## 2015-12-07 DIAGNOSIS — Z79899 Other long term (current) drug therapy: Secondary | ICD-10-CM | POA: Insufficient documentation

## 2015-12-07 MED ORDER — IBUPROFEN 800 MG PO TABS
800.0000 mg | ORAL_TABLET | Freq: Once | ORAL | Status: AC
  Administered 2015-12-07: 800 mg via ORAL
  Filled 2015-12-07: qty 1

## 2015-12-07 MED ORDER — PENICILLIN V POTASSIUM 500 MG PO TABS
500.0000 mg | ORAL_TABLET | Freq: Once | ORAL | Status: AC
  Administered 2015-12-07: 500 mg via ORAL
  Filled 2015-12-07: qty 1

## 2015-12-07 MED ORDER — BUPIVACAINE-EPINEPHRINE (PF) 0.5% -1:200000 IJ SOLN
1.8000 mL | Freq: Once | INTRAMUSCULAR | Status: DC
  Filled 2015-12-07: qty 1.8

## 2015-12-07 MED ORDER — PENICILLIN V POTASSIUM 500 MG PO TABS: 500.0000 mg | ORAL_TABLET | Freq: Four times a day (QID) | ORAL | Status: AC

## 2015-12-07 NOTE — ED Notes (Signed)
pa at bedside. 

## 2015-12-07 NOTE — ED Notes (Signed)
Pt c/o worsening of ongoing dental pain onset this morning, states when he woke up he had 3 dental fragments in his mouth from where his left lower molar fractured. Left lower proximal molar fractured with jagged edges.

## 2015-12-07 NOTE — ED Provider Notes (Signed)
CSN: [id number]     Arrival date & time 12/07/15  1157 History  By signing my name below, I, Soijett Blue, attest that this documentation has been prepared under the direction and in the presence of S. Lane Hacker, PA-C Electronically Signed: Soijett Blue, ED Scribe. 12/07/2015. 1:38 PM.    Chief Complaint  Patient presents with  . Dental Pain      The history is provided by the patient. No language interpreter was used.    Zachary Mack is a 35 y.o. male who presents to the Emergency Department complaining of chronic left lower dental pain worsening this morning. Pt reports that he woke up this morning with dental fragments in his mouth. Pt notes that his back wisdom tooth has been broken for awhile without any pain to the area until this morning. He states that has not tried any medications for the relief for his symptoms. He denies fever, chills, and any other symptoms. Pt denies allergies to any medications.    Past Medical History  Diagnosis Date  . Kidney stone    History reviewed. No pertinent past surgical history. History reviewed. No pertinent family history. Social History  Substance Use Topics  . Smoking status: Never Smoker   . Smokeless tobacco: None  . Alcohol Use: No    Review of Systems  A complete 10 system review of systems was obtained and all systems are negative except as noted in the HPI and PMH.   Allergies  Review of patient's allergies indicates no known allergies.  Home Medications   Prior to Admission medications   Medication Sig Start Date End Date Taking? Authorizing Provider  acetaminophen (TYLENOL) 500 MG tablet Take 1,000 mg by mouth every 4 (four) hours as needed for mild pain.  09/10/14   Historical Provider, MD  albuterol (PROVENTIL HFA;VENTOLIN HFA) 108 (90 BASE) MCG/ACT inhaler Inhale 2 puffs into the lungs every 2 (two) hours as needed for wheezing or shortness of breath (cough). 05/29/14   Mercedes Camprubi-Soms, PA-C   HYDROcodone-acetaminophen (NORCO) 5-325 MG per tablet Take 1-2 tablets by mouth every 6 (six) hours as needed for severe pain. Patient not taking: Reported on 09/23/2014 05/29/14   Mercedes Camprubi-Soms, PA-C  ibuprofen (ADVIL,MOTRIN) 800 MG tablet Take 800 mg by mouth 3 (three) times daily. 09/10/14   Historical Provider, MD  meloxicam (MOBIC) 7.5 MG tablet Take 2 tablets (15 mg total) by mouth daily. 09/17/15   Antony Madura, PA-C  methocarbamol (ROBAXIN) 500 MG tablet Take 1 tablet (500 mg total) by mouth 4 (four) times daily. 09/23/14   Garlon Hatchet, PA-C  naproxen (NAPROSYN) 500 MG tablet Take 1 tablet (500 mg total) by mouth 2 (two) times daily with a meal. 06/29/15   Garlon Hatchet, PA-C  oxyCODONE-acetaminophen (PERCOCET/ROXICET) 5-325 MG per tablet Take 1 tablet by mouth every 4 (four) hours as needed. 09/23/14   Garlon Hatchet, PA-C   BP 115/82 mmHg  Pulse 65  Temp(Src) 97.7 F (36.5 C) (Oral)  Resp 16  SpO2 96% Physical Exam  Constitutional: He is oriented to person, place, and time. He appears well-developed and well-nourished. No distress.  HENT:  Head: Normocephalic and atraumatic.  Mouth/Throat: Uvula is midline, oropharynx is clear and moist and mucous membranes are normal.  Broken tooth posterior bottom left molar. No erythema. No fluctuant area.   Eyes: EOM are normal.  Neck: Neck supple.  Cardiovascular: Normal rate.   Pulmonary/Chest: Effort normal. No respiratory distress.  Abdominal: He exhibits  no distension.  Musculoskeletal: Normal range of motion.  Neurological: He is alert and oriented to person, place, and time.  Skin: Skin is warm and dry.  Psychiatric: He has a normal mood and affect. His behavior is normal.  Nursing note and vitals reviewed.   ED Course  .Nerve Block Date/Time: 12/07/2015 9:48 PM Performed by: Althea GrimmerILEY, Shalini Mair NICOLE Authorized by: Melton KrebsILEY, Briyana Badman NICOLE Consent: Verbal consent obtained. Risks and benefits: risks, benefits and  alternatives were discussed Consent given by: patient Patient identity confirmed: verbally with patient and arm band Indications: pain relief Body area: face/mouth Nerve: inferior alveolar Laterality: left Local anesthetic: bupivacaine 0.5% with epinephrine Outcome: pain improved Patient tolerance: Patient tolerated the procedure well with no immediate complications    DIAGNOSTIC STUDIES: Oxygen Saturation is 96% on RA, nl by my interpretation.    COORDINATION OF CARE: 1:38 PM Discussed treatment plan with pt at bedside which includes dental block, ibuprofen Rx, and penicillin Rx and pt agreed to plan.   MDM   Final diagnoses:  Pain, dental    Patient with dentalgia.  No abscess requiring immediate incision and drainage.  Exam not concerning for Ludwig's angina or pharyngeal abscess.  Will treat with dental block in the ED, ibuprofen Rx, and penicillin Rx. Pt instructed to follow-up with dentist.  Discussed return precautions. Pt safe for discharge.   I personally performed the services described in this documentation, which was scribed in my presence. The recorded information has been reviewed and is accurate.    Melton KrebsSamantha Nicole Rambo Sarafian, PA-C 12/07/15 2149  Tilden FossaElizabeth Rees, MD 12/09/15 [phone]

## 2015-12-07 NOTE — Discharge Instructions (Signed)
[name],  Nice meeting you! Please follow-up with your dentist. If you do not have one, please see attached info. Return to the emergency department if you develop fevers, chills, increased pain, new/worsening symptoms. Feel better soon!  S. Lane HackerNicole Khiree Bukhari, PA-C  [employer] [facility] [employer] 211 is a great source of information about community services available.  Access by dialing 2-1-1 from anywhere in [place] , or by website -  PooledIncome.plwww.nc211.org.   Other Local Resources (Updated 07/2015)  Dental  Care   Services    Phone Number and Address  Cost  [place] [facility] For children 530 - 34 years of age:   Cleaning  Tooth brushing/flossing instruction  Sealants, fillings, crowns  Extractions  Emergency treatment  [phone] [address] [place], [place] [address] Charges based on family income.  Medicaid and some insurance plans accepted.     [facility]  Cleaning  Sealants, fillings, crowns  Extractions  Emergency treatment [phone] [address] [place], [place]  Pregnant women 34 years of age or older with a Medicaid card  [facility] [place]  Cleaning  Sealants, fillings, crowns  Extractions  Emergency treatment [phone] [address] [place], [place] Pregnant women 34 years of age or older with a Medicaid card  [facility] For children 90 - 34 years of age:   Cleaning  Tooth brushing/flossing instruction  Sealants, fillings, crowns  Extractions  Emergency treatment Limited orthodontic services for patients with Medicaid [phone] [address] [place], [place] [address] Medicaid and [facility] [place] Health Choice cover for children up to age 34 and pregnant women.  Parents of children up to age 34 without Medicaid pay a reduced fee at time of  service.  [facility] [place] For children 290 - 34 years of age:   Cleaning  Tooth brushing/flossing instruction  Sealants, fillings, crowns  Extractions  Emergency treatment Limited orthodontic services for patients with Medicaid [phone] [address] [place], [place].  Medicaid and [place] Health Choice cover for children up to age 34 and pregnant women.  Parents of children up to age 34 without Medicaid pay a reduced fee.  [facility]  Cleaning  Sealants, fillings, crowns  Extractions  Hours: Tuesdays and Thursdays, 4:15 - 8 pm [phone] [address], [address] [place], [place] [address] Services free of charge to [facility] residents ages 18-64 who do not have health insurance, Medicare, [place], or [place] benefits and fall within federal poverty guidelines  [facility]    Provides dental care in addition to primary medical care, nutritional counseling, and pharmacy:  Nurse, mental healthCleaning  Sealants, fillings, crowns  Extractions                  [phone] [facility], [address] [place], [place]  [phone] Ph[facility]as Real[facility]les [facility], [address] [place], [place]  [phone] [facility] [place], [place]  [phone] [facility], [address] [place], [place]  [phone] [facility] [address] [place], [place] Accepts [place], [place]are, most insurance.  Also provides services available to all with fees adjusted based on ability to pay.    [facility]  Cleaning  Tooth brushing/flossing instruction  Sealants, fillings,  crowns  Extractions  Emergency treatment Hours: Tuesdays, Thursdays, and Fridays from 8 am to 5 pm by appointment only. [phone] [address] [place], [place] [address] [facility]  residents with Medicaid (depending on eligibility) and children with [facility] Health Choice - call for more information.  [facility]  Extractions only  Hours: 2nd and 4th Thursday of each month from 6:30 am - 9 am.   [phone] [place], [place] [address] Ages 22 and older only.  Patients are seen on a first come, first served basis.  [employer]  [employer]  Extractions  Orthodontics  Endodontics  Implants/Crowns/Bridges  Complete and partial dentures [phone] [employer],  Patients must complete an application for services.  There is often a waiting list.

## 2016-01-02 ENCOUNTER — Encounter (HOSPITAL_COMMUNITY): Payer: Self-pay | Admitting: Emergency Medicine

## 2016-01-02 ENCOUNTER — Emergency Department (HOSPITAL_COMMUNITY)
Admission: EM | Admit: 2016-01-02 | Discharge: 2016-01-02 | Disposition: A | Discharge status: Home / Self Care | Payer: No Typology Code available for payment source | Attending: Emergency Medicine | Admitting: Emergency Medicine

## 2016-01-02 DIAGNOSIS — W231XXA Caught, crushed, jammed, or pinched between stationary objects, initial encounter: Secondary | ICD-10-CM | POA: Insufficient documentation

## 2016-01-02 DIAGNOSIS — S90211A Contusion of right great toe with damage to nail, initial encounter: Secondary | ICD-10-CM | POA: Insufficient documentation

## 2016-01-02 DIAGNOSIS — Y929 Unspecified place or not applicable: Secondary | ICD-10-CM | POA: Insufficient documentation

## 2016-01-02 DIAGNOSIS — Y998 Other external cause status: Secondary | ICD-10-CM | POA: Insufficient documentation

## 2016-01-02 DIAGNOSIS — Y9367 Activity, basketball: Secondary | ICD-10-CM | POA: Insufficient documentation

## 2016-01-02 NOTE — Discharge Instructions (Signed)
Try to clip you are toenail as short as possible, your injury this morning .  Lifted part of the nail away from the nail bed  it's best to leave the nail in place as it acts a natural bandage.

## 2016-01-02 NOTE — ED Provider Notes (Signed)
CSN: [id number]     Arrival date & time 01/02/16  1700 History  By signing my name below, I, Ronney LionSuzanne Le, attest that this documentation has been prepared under the direction and in the presence of Earley FavorGail Caridad Silveira, NP. Electronically Signed: Ronney LionSuzanne Le, ED Scribe. 01/02/2016. 5:49 PM.    Chief Complaint  Patient presents with  . Toe Pain   The history is provided by the patient. No language interpreter was used.    HPI Comments: Zachary Mack is a 35 y.o. male who presents to the Emergency Department complaining of constant, aching, right great toe pain that began 2 weeks ago after stubbing his toe, which acutely worsened this morning after stubbing his toe again while playing basketball. Patient also reports his nail appears to "not be attached anymore" and appeared to be bruised underneath the nail since re-injuring his nail this morning. He reports there was no bleeding when he initially stubbed his toe 2 weeks ago, but he did experience immediate, throbbing pain at that time. He states he felt unable to stand for long periods of time today secondary to his pain.      Past Medical History  Diagnosis Date  . Kidney stone    History reviewed. No pertinent past surgical history. No family history on file. Social History  Substance Use Topics  . Smoking status: Never Smoker   . Smokeless tobacco: None  . Alcohol Use: No    Review of Systems  Musculoskeletal: Positive for arthralgias (right great toe pain).  Skin: Positive for color change (bruising to right great toenail).  All other systems reviewed and are negative.     Allergies  Review of patient's allergies indicates no known allergies.  Home Medications   Prior to Admission medications   Medication Sig Start Date End Date Taking? Authorizing Provider  acetaminophen (TYLENOL) 500 MG tablet Take 1,000 mg by mouth every 4 (four) hours as needed for mild pain.  09/10/14   Historical Provider, MD  albuterol (PROVENTIL  HFA;VENTOLIN HFA) 108 (90 BASE) MCG/ACT inhaler Inhale 2 puffs into the lungs every 2 (two) hours as needed for wheezing or shortness of breath (cough). 05/29/14   Mercedes Camprubi-Soms, PA-C  HYDROcodone-acetaminophen (NORCO) 5-325 MG per tablet Take 1-2 tablets by mouth every 6 (six) hours as needed for severe pain. Patient not taking: Reported on 09/23/2014 05/29/14   Mercedes Camprubi-Soms, PA-C  ibuprofen (ADVIL,MOTRIN) 800 MG tablet Take 800 mg by mouth 3 (three) times daily. 09/10/14   Historical Provider, MD  meloxicam (MOBIC) 7.5 MG tablet Take 2 tablets (15 mg total) by mouth daily. 09/17/15   Antony MaduraKelly Humes, PA-C  methocarbamol (ROBAXIN) 500 MG tablet Take 1 tablet (500 mg total) by mouth 4 (four) times daily. 09/23/14   Garlon HatchetLisa M Sanders, PA-C  naproxen (NAPROSYN) 500 MG tablet Take 1 tablet (500 mg total) by mouth 2 (two) times daily with a meal. 06/29/15   Garlon HatchetLisa M Sanders, PA-C  oxyCODONE-acetaminophen (PERCOCET/ROXICET) 5-325 MG per tablet Take 1 tablet by mouth every 4 (four) hours as needed. 09/23/14   Garlon HatchetLisa M Sanders, PA-C   BP 105/89 mmHg  Pulse 91  Temp(Src) 98.2 F (36.8 C) (Oral)  Resp 16  SpO2 98% Physical Exam  Constitutional: He is oriented to person, place, and time. He appears well-developed and well-nourished. No distress.  HENT:  Head: Normocephalic and atraumatic.  Eyes: Conjunctivae and EOM are normal.  Neck: Neck supple. No tracheal deviation present.  Cardiovascular: Normal rate.   Pulmonary/Chest: Effort  normal. No respiratory distress.  Musculoskeletal: Normal range of motion.  Neurological: He is alert and oriented to person, place, and time.  Skin: Skin is warm and dry.  Psychiatric: He has a normal mood and affect. His behavior is normal.  Nursing note and vitals reviewed.   ED Course  Procedures (including critical care time)  DIAGNOSTIC STUDIES: Oxygen Saturation is 98% on RA, normal by my interpretation.    COORDINATION OF CARE: 5:37 PM - Discussed  treatment plan with pt at bedside which includes ibuprofen prn for pain relief. Advised pt to trim toenail to avoid re-injuring it, as well as to tape the toenail while playing basketball. Pt verbalized understanding and agreed to plan.   MDM   Final diagnoses:  Subungual hematoma of great toe of right foot, initial encounter   I personally performed the services described in this documentation, which was scribed in my presence. The recorded information has been reviewed and is accurate.    Earley FavorGail Devera Englander, NP 01/20/16 2027  Lyndal Pulleyaniel Knott, MD 01/21/16 [phone]

## 2016-01-02 NOTE — ED Notes (Signed)
Pt c/o right sided toe pain. Pt states he hurt his toe nail a few weeks ago but this morning jammed it during basketball. Bruise noted underneath toenail. Pt can still move toe and has sensation.

## 2017-04-23 ENCOUNTER — Emergency Department (HOSPITAL_COMMUNITY): Payer: Self-pay

## 2017-04-23 ENCOUNTER — Emergency Department (HOSPITAL_COMMUNITY)
Admission: EM | Admit: 2017-04-23 | Discharge: 2017-04-23 | Disposition: A | Discharge status: Home / Self Care | Payer: Self-pay | Attending: Emergency Medicine | Admitting: Emergency Medicine

## 2017-04-23 ENCOUNTER — Encounter (HOSPITAL_COMMUNITY): Payer: Self-pay

## 2017-04-23 DIAGNOSIS — Z79899 Other long term (current) drug therapy: Secondary | ICD-10-CM | POA: Insufficient documentation

## 2017-04-23 DIAGNOSIS — Y929 Unspecified place or not applicable: Secondary | ICD-10-CM | POA: Insufficient documentation

## 2017-04-23 DIAGNOSIS — Y999 Unspecified external cause status: Secondary | ICD-10-CM | POA: Insufficient documentation

## 2017-04-23 DIAGNOSIS — X58XXXA Exposure to other specified factors, initial encounter: Secondary | ICD-10-CM | POA: Insufficient documentation

## 2017-04-23 DIAGNOSIS — Y939 Activity, unspecified: Secondary | ICD-10-CM | POA: Insufficient documentation

## 2017-04-23 DIAGNOSIS — S93602A Unspecified sprain of left foot, initial encounter: Secondary | ICD-10-CM | POA: Insufficient documentation

## 2017-04-23 HISTORY — DX: Depression, unspecified: F32.A

## 2017-04-23 HISTORY — DX: Major depressive disorder, single episode, unspecified: F32.9

## 2017-04-23 MED ORDER — DICLOFENAC SODIUM 50 MG PO TBEC: 50.0000 mg | DELAYED_RELEASE_TABLET | Freq: Two times a day (BID) | ORAL | 0 refills | Status: DC

## 2017-04-23 NOTE — ED Notes (Signed)
Pt declined need for ice pack.

## 2017-04-23 NOTE — ED Triage Notes (Signed)
Patient states he has had pain in the left foot x 6 months. Yesterday, he stepped off of a ladder and the pain is worse.

## 2017-04-23 NOTE — ED Provider Notes (Signed)
[facility]-EMERGENCY DEPT Provider Note   CSN: [id number] Arrival date & time: 04/23/17  1049     History   Chief Complaint Chief Complaint  Patient presents with  . Foot Pain    HPI Zachary Mack is a 36 y.o. male who presents to the ED with foot pain. The pain is located in the left foot. Patient reports having had the pain for 6 months but yesterday he stepped off a ladder and the pain got worse. Patient reports that he works 2 jobs so he is one place on his feet all day and then goes to his second job where he continues to on his feet.   HPI  Past Medical History:  Diagnosis Date  . Depression   . Kidney stone     There are no active problems to display for this patient.   History reviewed. No pertinent surgical history.     Home Medications    Prior to Admission medications   Medication Sig Start Date End Date Taking? Authorizing Provider  acetaminophen (TYLENOL) 500 MG tablet Take 1,000 mg by mouth every 4 (four) hours as needed for mild pain.  09/10/14   [provider]  albuterol (PROVENTIL HFA;VENTOLIN HFA) 108 (90 BASE) MCG/ACT inhaler Inhale 2 puffs into the lungs every 2 (two) hours as needed for wheezing or shortness of breath (cough). 05/29/14   Street, [place], PA-C  diclofenac (VOLTAREN) 50 MG EC tablet Take 1 tablet (50 mg total) by mouth 2 (two) times daily. 04/23/17   Janne Napoleon, NP  meloxicam (MOBIC) 7.5 MG tablet Take 2 tablets (15 mg total) by mouth daily. 09/17/15   Antony Madura, PA-C  methocarbamol (ROBAXIN) 500 MG tablet Take 1 tablet (500 mg total) by mouth 4 (four) times daily. 09/23/14   Garlon Hatchet, PA-C    Family History Family History  Problem Relation Age of Onset  . Cancer Father     Social History Social History  Substance Use Topics  . Smoking status: Never Smoker  . Smokeless tobacco: Never Used  . Alcohol use No     Allergies   Patient has no known allergies.   Review of Systems Review of Systems    Musculoskeletal: Positive for arthralgias.       Left foot pain  Skin: Negative for wound.  All other systems reviewed and are negative.    Physical Exam Updated Vital Signs BP 103/83   Pulse 60   Temp 98 F (36.7 C) (Oral)   Resp 16   Ht  (1.778 m)   Wt 74.8 kg (165 lb)   SpO2 95%   BMI 23.68 kg/m   Physical Exam  Constitutional: He appears well-developed and well-nourished. No distress.  HENT:  Head: Normocephalic.  Eyes: EOM are normal.  Neck: Neck supple.  Cardiovascular: Normal rate.   Pulmonary/Chest: Effort normal.  Musculoskeletal:       Left foot: There is tenderness. There is normal range of motion, no swelling, normal capillary refill, no crepitus, no deformity and no laceration.  Pedal pulses 2+, adequate circulation. Pain with palpation of the lateral aspect of the left foot that radiates to the ankle.   Neurological: He is alert.  Skin: Skin is warm and dry.  Nursing note and vitals reviewed.    ED Treatments / Results  Labs (all labs ordered are listed, but only abnormal results are displayed) Labs Reviewed - No data to display   Radiology Dg Foot Complete Left  Result  Date: 04/23/2017 CLINICAL DATA:  Left foot pain for 3 days. EXAM: LEFT FOOT - COMPLETE 3+ VIEW COMPARISON:  None. FINDINGS: There is no evidence of fracture or dislocation. There is no evidence of arthropathy or other focal bone abnormality. Soft tissues are unremarkable. IMPRESSION: Negative. Electronically Signed   By: Ted Mcalpine M.D.   On: 04/23/2017 12:20    Procedures Procedures (including critical care time)  Medications Ordered in ED Medications - No data to display   Initial Impression / Assessment and Plan / ED Course  I have reviewed the triage vital signs and the nursing notes.  36 y.o. male with left foot pain that radiates to the ankle stable for d/c without fracture or dislocation noted on x-ray and no focal neuro deficits. ASO applied, NSAIDS and  f/u with ortho if symptoms persist. Patient agrees with plan.  Final Clinical Impressions(s) / ED Diagnoses   Final diagnoses:  Foot sprain, left, initial encounter    New Prescriptions New Prescriptions   DICLOFENAC (VOLTAREN) 50 MG EC TABLET    Take 1 tablet (50 mg total) by mouth 2 (two) times daily.     Kerrie Buffalo [place], [place] 04/23/17 1335    Raeford Razor, MD 04/24/17 1246

## 2017-05-16 ENCOUNTER — Emergency Department (HOSPITAL_COMMUNITY): Payer: Self-pay

## 2017-05-16 ENCOUNTER — Emergency Department (HOSPITAL_COMMUNITY)
Admission: EM | Admit: 2017-05-16 | Discharge: 2017-05-16 | Disposition: A | Discharge status: Home / Self Care | Payer: Self-pay | Attending: Emergency Medicine | Admitting: Emergency Medicine

## 2017-05-16 DIAGNOSIS — M25521 Pain in right elbow: Secondary | ICD-10-CM | POA: Insufficient documentation

## 2017-05-16 NOTE — Discharge Instructions (Signed)
Alternate 600 mg of ibuprofen and [id number] mg of Tylenol every 3 hours as needed for pain. Do not exceed 4000 mg of Tylenol daily.  Apply ice or heat for comfort.  Avoid heavy lifting or repetitive motion.  Follow-up with a primary care physician or orthopedic physician if symptoms persist greater than 1 week.  Return to the ED immediately for any concerning signs or symptoms develop such as fevers, severe worsening of pain, swelling, redness, or weakness.

## 2017-05-16 NOTE — ED Triage Notes (Signed)
Patient fell from a ladder injuring right elbow yesterday. Patient states that as he fell his elbow hit the body of a chain saw. +swelling/bruising and limited ROM in affected extremity. Pain increases with movement, no pain at all with rest.

## 2017-05-16 NOTE — ED Provider Notes (Signed)
[facility]-EMERGENCY DEPT Provider Note   CSN: [id number] Arrival date & time: 05/16/17  1216     History   Chief Complaint Chief Complaint  Patient presents with  . Joint Swelling    HPI Zachary Mack is a 36 y.o. male who presents today with chief complaint acute onset, intermittent right elbow pain since yesterday.  He states that he was on a ladder attempting to saw off a tree branch when he slipped from a low height and fell backwards.  The chainsaw hit his right elbow.  He denies head injury or loss of consciousness.  He endorses swelling and bruising to the medial aspect of the right elbow.  He has no pain with his elbow extended but pain occurs during range of motion of the elbow.  The pain worsens with palpation.  He endorses intermittent numbness and tingling of his right fourth and fifth digits.  Denies weakness.  He has tried ibuprofen and ice with improvement in his symptoms.  Denies headaches or neck pain.  He is right-hand dominant.  The history is provided by the patient.    Past Medical History:  Diagnosis Date  . Depression   . Kidney stone     There are no active problems to display for this patient.   No past surgical history on file.     Home Medications    Prior to Admission medications   Medication Sig Start Date End Date Taking? Authorizing Provider  acetaminophen (TYLENOL) 500 MG tablet Take 1,000 mg by mouth every 4 (four) hours as needed for mild pain.  09/10/14   [provider]  albuterol (PROVENTIL HFA;VENTOLIN HFA) 108 (90 BASE) MCG/ACT inhaler Inhale 2 puffs into the lungs every 2 (two) hours as needed for wheezing or shortness of breath (cough). 05/29/14   Street, [place], PA-C  diclofenac (VOLTAREN) 50 MG EC tablet Take 1 tablet (50 mg total) by mouth 2 (two) times daily. 04/23/17   Janne NapoleonNeese, Hope M, NP  meloxicam (MOBIC) 7.5 MG tablet Take 2 tablets (15 mg total) by mouth daily. 09/17/15   Antony MaduraHumes, Kelly, PA-C    methocarbamol (ROBAXIN) 500 MG tablet Take 1 tablet (500 mg total) by mouth 4 (four) times daily. 09/23/14   Garlon HatchetSanders, Lisa M, PA-C    Family History Family History  Problem Relation Age of Onset  . Cancer Father     Social History Social History   Tobacco Use  . Smoking status: Never Smoker  . Smokeless tobacco: Never Used  Substance Use Topics  . Alcohol use: No  . Drug use: Yes    Types: Marijuana     Allergies   Patient has no known allergies.   Review of Systems Review of Systems  Constitutional: Negative for chills and fever.  Eyes: Negative for visual disturbance.  Musculoskeletal: Positive for arthralgias.  Skin: Positive for color change.  Neurological: Positive for numbness. Negative for syncope, weakness and headaches.     Physical Exam Updated Vital Signs BP 120/81 (BP Location: Right Arm)   Pulse 71   Temp 98.2 F (36.8 C) (Oral)   Resp 16   Ht 5\' 10"  (1.778 m)   Wt 72.6 kg (160 lb)   SpO2 98%   BMI 22.96 kg/m   Physical Exam  Constitutional: He appears well-developed and well-nourished. No distress.  HENT:  Head: Normocephalic and atraumatic.  Eyes: Conjunctivae and EOM are normal. Pupils are equal, round, and reactive to light. Right eye exhibits no discharge.  Left eye exhibits no discharge.  Neck: Normal range of motion. Neck supple. No JVD present. No tracheal deviation present.  Cardiovascular: Normal rate and intact distal pulses.  2+ radial pulses bilaterally  Pulmonary/Chest: Effort normal.  Abdominal: He exhibits no distension.  Musculoskeletal: He exhibits tenderness. He exhibits no edema.       Right elbow: He exhibits decreased range of motion. Tenderness found. Medial epicondyle tenderness noted. No radial head, no lateral epicondyle and no olecranon process tenderness noted.  3 cm area of ecchymosis and swelling and tenderness to the right elbow near the medial epicondyle.  No ligamentous laxity noted.  Decreased range of motion  with flexion secondary to pain.  5/5 strength of the BUE major muscle groups.  Normal examination of the joints of the right upper extremity otherwise.  No crepitus noted.  Neurological: He is alert.  Fluent speech, no facial droop, sensation intact to soft touch of bilateral upper extremities  Skin: Skin is warm and dry. No erythema.  Psychiatric: He has a normal mood and affect. His behavior is normal.  Nursing note and vitals reviewed.    ED Treatments / Results  Labs (all labs ordered are listed, but only abnormal results are displayed) Labs Reviewed - No data to display  EKG  EKG Interpretation None       Radiology Dg Elbow Complete Right  Result Date: 05/16/2017 CLINICAL DATA:  36 y/o  M; fall with pain. EXAM: RIGHT ELBOW - COMPLETE 3+ VIEW COMPARISON:  None. FINDINGS: There is no evidence of fracture, dislocation, or joint effusion. There is no evidence of arthropathy or other focal bone abnormality. Soft tissues are unremarkable. IMPRESSION: Negative. Electronically Signed   By: Mitzi HansenLance  Furusawa-Stratton M.D.   On: 05/16/2017 14:15    Procedures Procedures (including critical care time)  Medications Ordered in ED Medications - No data to display   Initial Impression / Assessment and Plan / ED Course  I have reviewed the triage vital signs and the nursing notes.  Pertinent labs & imaging results that were available during my care of the patient were reviewed by me and considered in my medical decision making (see chart for details).     Patient with right elbow pain secondary to injury yesterday.  Ecchymosis noted.  Afebrile, vital signs are stable, he is neurovascularly intact.  Denies head injury or loss of consciousness.  Radiographs of the elbow show no fracture or dislocation.  RICE Therapy indicated and discussed.  Patient will be discharged with instructions for NSAIDs, Tylenol, ice, heat, and follow-up with orthopedic physician if symptoms persist.  Discussed  indications for return to the ED. Pt verbalized understanding of and agreement with plan and is safe for discharge home at this time.  No complaints prior to discharge.  Final Clinical Impressions(s) / ED Diagnoses   Final diagnoses:  Right elbow pain    ED Discharge Orders    None       Bennye AlmFawze, Hawkin Charo A, PA-C 05/16/17 1444    Azalia Bilisampos, Kevin, MD 05/17/17 2040

## 2017-12-14 ENCOUNTER — Emergency Department (HOSPITAL_COMMUNITY): Admission: EM | Admit: 2017-12-14 | Discharge: 2017-12-14 | Discharge status: Against Medical Advice | Payer: Self-pay

## 2017-12-14 NOTE — ED Notes (Signed)
Pt called for triage, no response from lobby 

## 2018-09-27 ENCOUNTER — Emergency Department (HOSPITAL_COMMUNITY)
Admission: EM | Admit: 2018-09-27 | Discharge: 2018-09-27 | Disposition: A | Discharge status: Home / Self Care | Payer: Self-pay | Attending: Emergency Medicine | Admitting: Emergency Medicine

## 2018-09-27 ENCOUNTER — Encounter (HOSPITAL_COMMUNITY): Payer: Self-pay

## 2018-09-27 ENCOUNTER — Other Ambulatory Visit: Payer: Self-pay

## 2018-09-27 DIAGNOSIS — S0591XA Unspecified injury of right eye and orbit, initial encounter: Secondary | ICD-10-CM | POA: Insufficient documentation

## 2018-09-27 DIAGNOSIS — Y9389 Activity, other specified: Secondary | ICD-10-CM | POA: Insufficient documentation

## 2018-09-27 DIAGNOSIS — W500XXA Accidental hit or strike by another person, initial encounter: Secondary | ICD-10-CM | POA: Insufficient documentation

## 2018-09-27 DIAGNOSIS — Y998 Other external cause status: Secondary | ICD-10-CM | POA: Insufficient documentation

## 2018-09-27 DIAGNOSIS — F1210 Cannabis abuse, uncomplicated: Secondary | ICD-10-CM | POA: Insufficient documentation

## 2018-09-27 DIAGNOSIS — Y929 Unspecified place or not applicable: Secondary | ICD-10-CM | POA: Insufficient documentation

## 2018-09-27 MED ORDER — TETRACAINE HCL 0.5 % OP SOLN
2.0000 [drp] | Freq: Once | OPHTHALMIC | Status: AC
  Administered 2018-09-27: 2 [drp] via OPHTHALMIC
  Filled 2018-09-27: qty 4

## 2018-09-27 MED ORDER — KETOROLAC TROMETHAMINE 0.5 % OP SOLN
1.0000 [drp] | Freq: Once | OPHTHALMIC | Status: AC
Start: 2018-09-27 — End: 2018-09-27
  Administered 2018-09-27: 1 [drp] via OPHTHALMIC
  Filled 2018-09-27: qty 5

## 2018-09-27 MED ORDER — FLUORESCEIN SODIUM 1 MG OP STRP
1.0000 | ORAL_STRIP | Freq: Once | OPHTHALMIC | Status: AC
  Administered 2018-09-27: 1 via OPHTHALMIC
  Filled 2018-09-27: qty 1
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
## 2018-09-27 NOTE — ED Notes (Signed)
Discharge instructions reviewed with patient. Patient verbalizes understanding. VSS.   

## 2018-09-27 NOTE — Discharge Instructions (Addendum)
You have sustained a right eye contusion, which should improve with time.  We are providing an eyedrop to use ketorolac, 1 drop in the right eye 4 times a day to help with the pain.  You can also use Tylenol 650 mg every 4 hours for pain.  Additionally, using a cool compress in your right eye several times a day, can be helpful.  Rest today, and stay out of bright lights.  Gradually increase your activity over the next couple of days.  Follow-up with the on-call ophthalmologist if you have not persistent problems or concerns.

## 2019-04-12 IMAGING — CR DG FOOT COMPLETE 3+V*L*
3 series · 3 of 3 positions shown · non-contrast
Comparison: None.

CLINICAL DATA: Left foot pain for 3 days.

EXAM:
LEFT FOOT - COMPLETE 3+ VIEW

[x foot ap left]
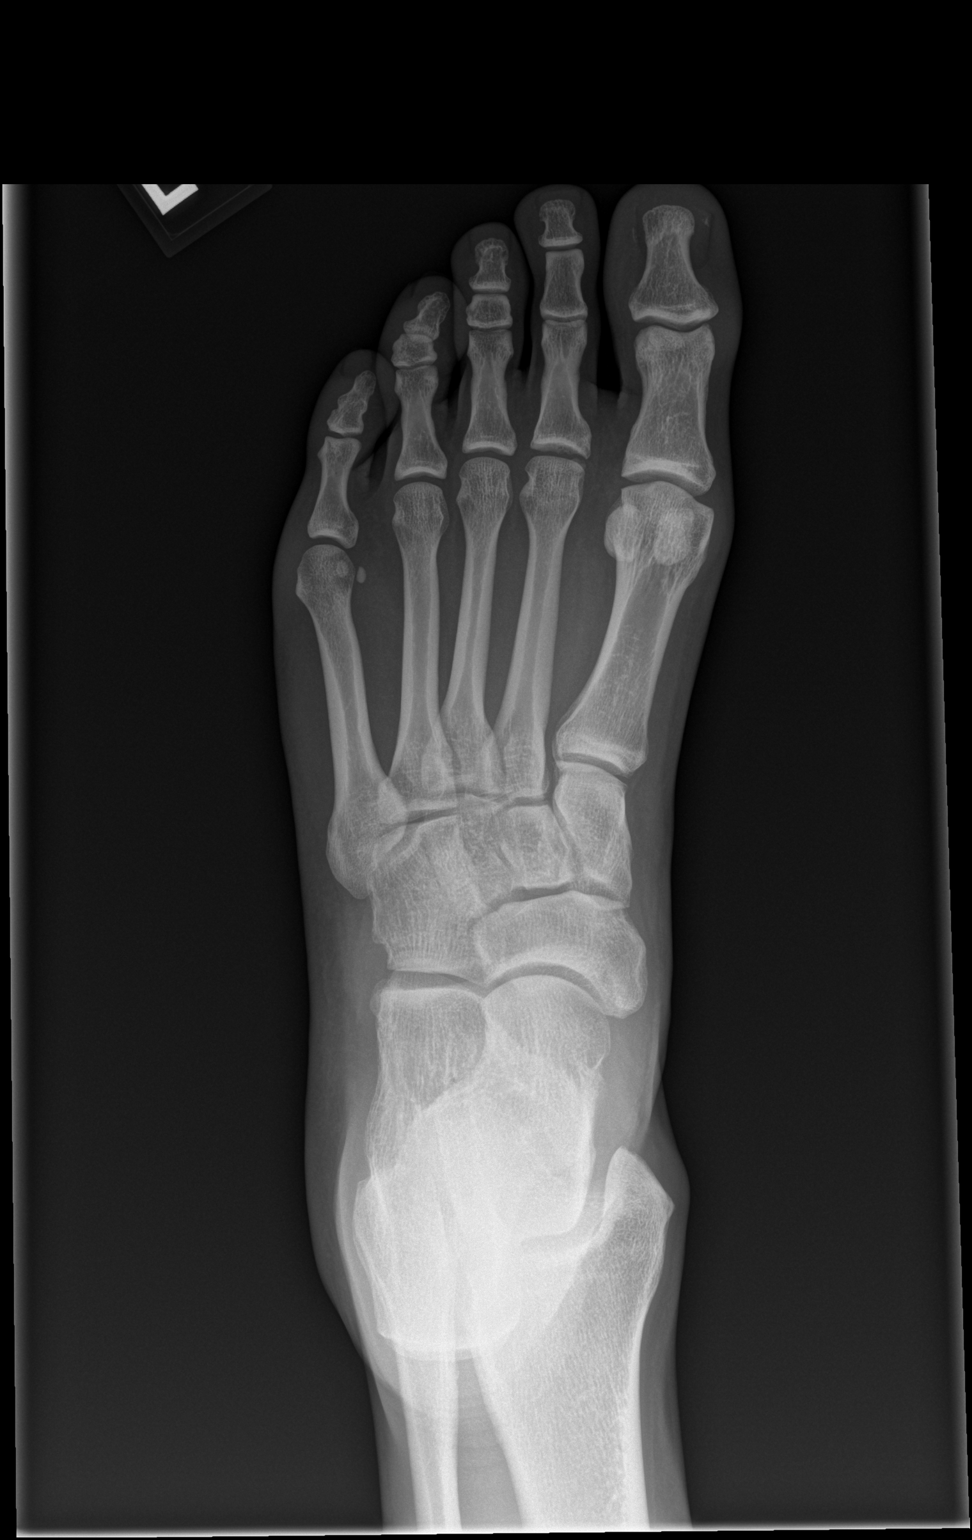

[x foot obl left]
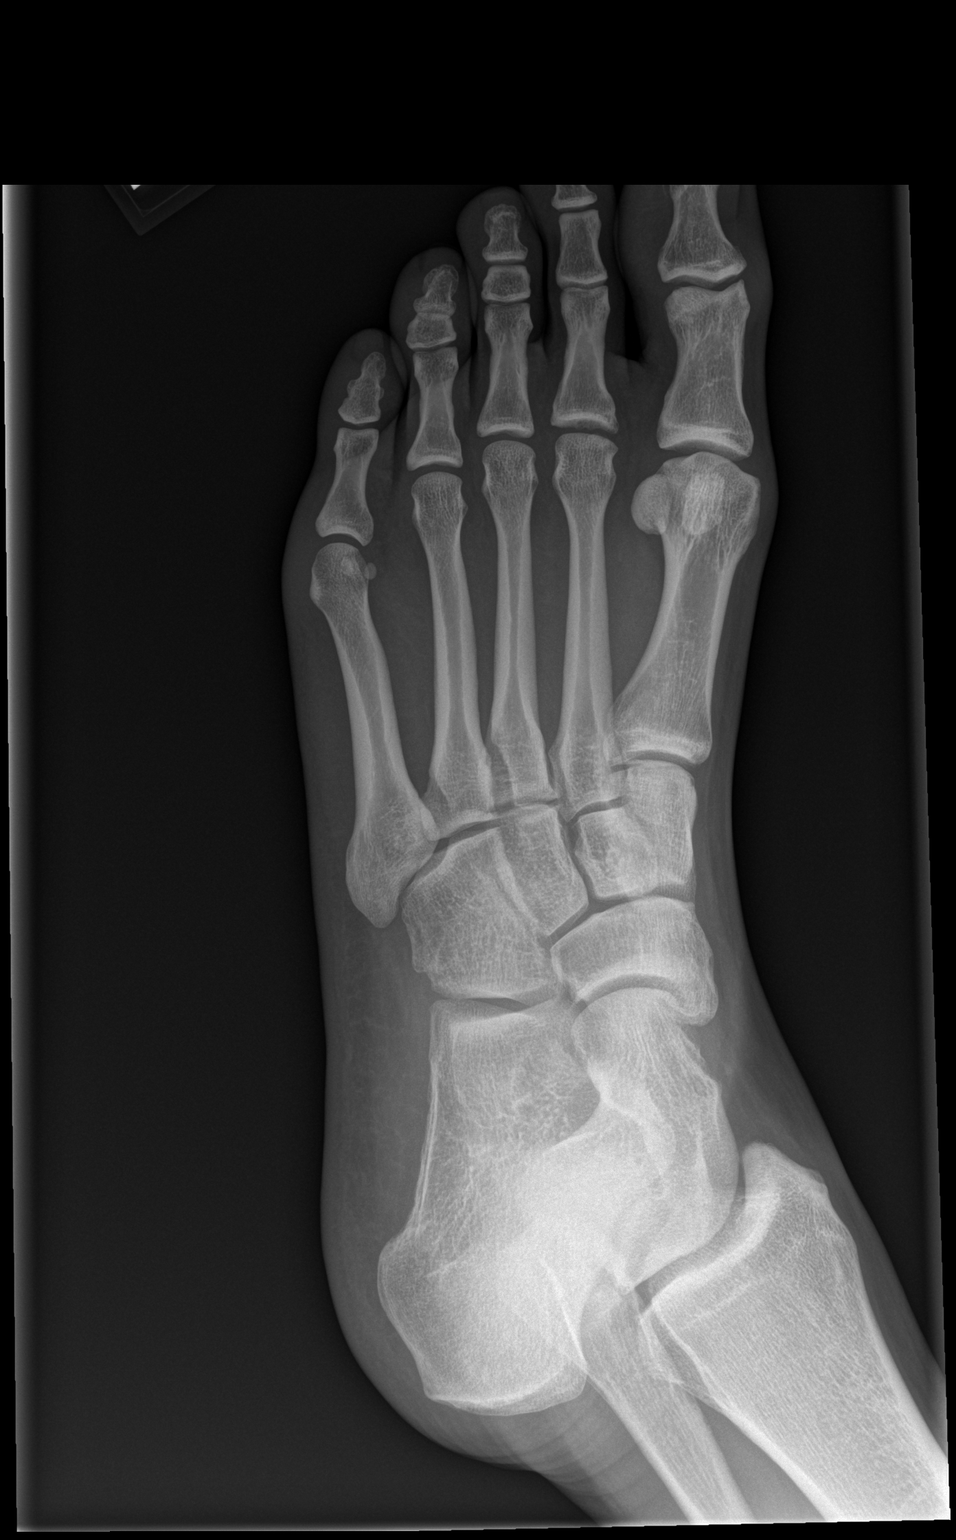

[x foot lat left]
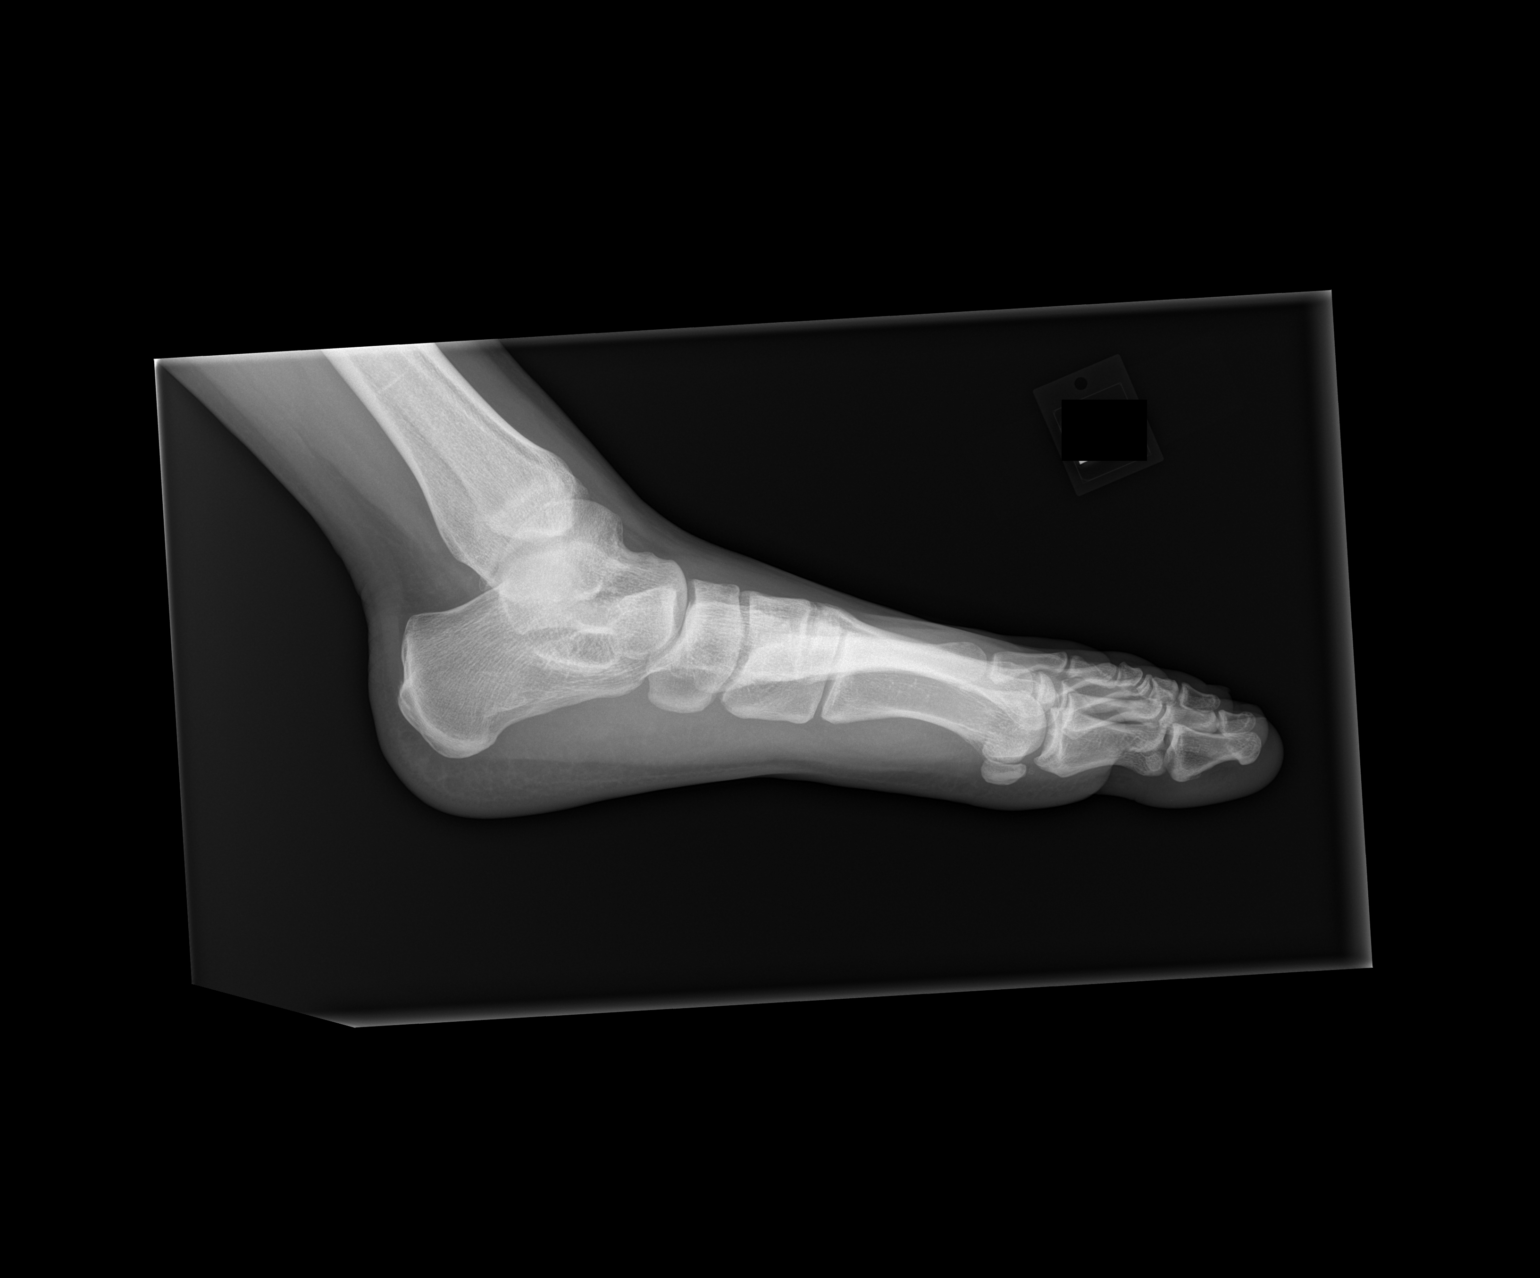

[3 of 3 positions shown; findings below may reference images not displayed]

FINDINGS: There is no evidence of fracture or dislocation. There is no
evidence of arthropathy or other focal bone abnormality. Soft
tissues are unremarkable.
IMPRESSION: Negative.

## 2019-08-06 ENCOUNTER — Other Ambulatory Visit: Payer: Self-pay

## 2019-08-06 ENCOUNTER — Ambulatory Visit: Payer: 59 | Attending: Internal Medicine

## 2019-08-06 DIAGNOSIS — Z20822 Contact with and (suspected) exposure to covid-19: Secondary | ICD-10-CM

## 2019-08-07 LAB — NOVEL CORONAVIRUS, NAA: SARS-CoV-2, NAA: NOT DETECTED

## 2019-08-14 ENCOUNTER — Other Ambulatory Visit: Payer: 59

## 2019-08-14 ENCOUNTER — Ambulatory Visit: Payer: 59 | Attending: Internal Medicine

## 2019-08-14 DIAGNOSIS — Z20822 Contact with and (suspected) exposure to covid-19: Secondary | ICD-10-CM

## 2019-08-15 LAB — NOVEL CORONAVIRUS, NAA: SARS-CoV-2, NAA: NOT DETECTED

## 2019-09-02 ENCOUNTER — Ambulatory Visit: Payer: 59 | Attending: Internal Medicine

## 2019-09-02 DIAGNOSIS — Z20822 Contact with and (suspected) exposure to covid-19: Secondary | ICD-10-CM

## 2019-09-03 LAB — NOVEL CORONAVIRUS, NAA: SARS-CoV-2, NAA: NOT DETECTED

## 2019-11-09 ENCOUNTER — Emergency Department (HOSPITAL_COMMUNITY): Admission: EM | Admit: 2019-11-09 | Discharge: 2019-11-10 | Discharge status: Against Medical Advice | Payer: 59

## 2019-11-09 ENCOUNTER — Other Ambulatory Visit: Payer: Self-pay

## 2019-11-09 NOTE — ED Triage Notes (Signed)
Pt called for triage with no respond

## 2019-12-24 ENCOUNTER — Other Ambulatory Visit: Payer: Self-pay

## 2019-12-24 ENCOUNTER — Ambulatory Visit (HOSPITAL_COMMUNITY)
Admission: EM | Admit: 2019-12-24 | Discharge: 2019-12-24 | Disposition: A | Discharge status: Home / Self Care | Payer: Self-pay | Attending: Family Medicine | Admitting: Family Medicine

## 2019-12-24 DIAGNOSIS — L089 Local infection of the skin and subcutaneous tissue, unspecified: Secondary | ICD-10-CM

## 2019-12-24 DIAGNOSIS — B9689 Other specified bacterial agents as the cause of diseases classified elsewhere: Secondary | ICD-10-CM

## 2019-12-24 MED ORDER — DOXYCYCLINE HYCLATE 100 MG PO CAPS: 100.0000 mg | ORAL_CAPSULE | Freq: Two times a day (BID) | ORAL | 0 refills | Status: DC

## 2019-12-24 NOTE — ED Provider Notes (Signed)
[facility]   [id number] 12/24/19 Arrival Time: 1456  ASSESSMENT & PLAN:  1. Localized bacterial skin infection     No abscess requring I&D at this time. Observe.  Begin: Meds ordered this encounter  Medications  . doxycycline (VIBRAMYCIN) 100 MG capsule    Sig: Take 1 capsule (100 mg total) by mouth 2 (two) times daily.    Dispense:  14 capsule    Refill:  0      Follow-up Information    [facility].   Specialty: Urgent Care Why: If worsening or failing to improve as anticipated. Contact information: [address] [place] [address] [phone]              Reviewed expectations re: course of current medical issues. Questions answered. Outlined signs and symptoms indicating need for more acute intervention. Understanding verbalized. After Visit Summary given.   SUBJECTIVE: History from: patient. Zachary Mack is a 39 y.o. male who reports noting a small red area just below his left ear; 2 days; questions slight enlargement; some soreness associated. Afebrile. No h/o similar. No drainage or bleeding.    OBJECTIVE:  Vitals:   12/24/19 1514 12/24/19 1515  BP:  116/70  Pulse: 69   Resp: 16   Temp: 98.1 F (36.7 C)   SpO2: 95%     General appearance: alert; no distress Eyes: PERRLA; EOMI; conjunctiva normal HENT: [place]; AT; just below left earlobe is approx 0.5 cm area of erythema with slight induration; no drainage or bleeding; no fluctuance Neck: supple  Lungs: speaks full sentences without difficulty; unlabored Extremities: no edema Skin: warm and dry Neurologic: normal gait Psychological: alert and cooperative; normal mood and affect    No Known Allergies  Past Medical History:  Diagnosis Date  . Depression   . Kidney stone    Social History   Socioeconomic History  . Marital status: Significant Other    Spouse name: Not on file  . Number of children: Not on file  . Years of  education: Not on file  . Highest education level: Not on file  Occupational History  . Not on file  Tobacco Use  . Smoking status: Never Smoker  . Smokeless tobacco: Never Used  Vaping Use  . Vaping Use: Never used  Substance and Sexual Activity  . Alcohol use: No  . Drug use: Yes    Types: Marijuana  . Sexual activity: Not on file  Other Topics Concern  . Not on file  Social History Narrative  . Not on file   Social Determinants of Health   Financial Resource Strain:   . Difficulty of Paying Living Expenses:   Food Insecurity:   . Worried About Programme researcher, broadcasting/film/video in the Last Year:   . Barista in the Last Year:   Transportation Needs:   . Freight forwarder (Medical):   Marland Kitchen Lack of Transportation (Non-Medical):   Physical Activity:   . Days of Exercise per Week:   . Minutes of Exercise per Session:   Stress:   . Feeling of Stress :   Social Connections:   . Frequency of Communication with Friends and Family:   . Frequency of Social Gatherings with Friends and Family:   . Attends Religious Services:   . Active Member of Clubs or Organizations:   . Attends Banker Meetings:   Marland Kitchen Marital Status:   Intimate Partner Violence:   . Fear  of Current or Ex-Partner:   . Emotionally Abused:   Marland Kitchen Physically Abused:   . Sexually Abused:    Family History  Problem Relation Age of Onset  . Cancer Father    No past surgical history on file.   Vanessa Kick, MD 12/24/19 1544

## 2019-12-24 NOTE — ED Triage Notes (Signed)
Pt c/o pain and swelling under left ear x 2 days.

## 2020-03-17 ENCOUNTER — Other Ambulatory Visit: Payer: Self-pay

## 2020-03-17 DIAGNOSIS — Z20822 Contact with and (suspected) exposure to covid-19: Secondary | ICD-10-CM

## 2020-03-18 LAB — NOVEL CORONAVIRUS, NAA: SARS-CoV-2, NAA: NOT DETECTED

## 2020-08-07 ENCOUNTER — Ambulatory Visit: Payer: 59 | Admitting: Family Medicine

## 2020-09-11 ENCOUNTER — Ambulatory Visit: Payer: 59 | Admitting: Physician Assistant

## 2020-09-11 DIAGNOSIS — Z0289 Encounter for other administrative examinations: Secondary | ICD-10-CM

## 2020-09-28 ENCOUNTER — Inpatient Hospital Stay: Payer: 59

## 2020-09-28 ENCOUNTER — Inpatient Hospital Stay: Payer: 59 | Admitting: Genetic Counselor

## 2020-10-16 ENCOUNTER — Ambulatory Visit: Payer: 59 | Admitting: Family Medicine

## 2020-10-22 ENCOUNTER — Other Ambulatory Visit: Payer: Self-pay

## 2020-10-22 ENCOUNTER — Inpatient Hospital Stay: Payer: 59

## 2020-10-22 ENCOUNTER — Inpatient Hospital Stay: Payer: 59 | Attending: Radiation Oncology | Admitting: Genetic Counselor

## 2020-10-22 DIAGNOSIS — Z800 Family history of malignant neoplasm of digestive organs: Secondary | ICD-10-CM

## 2020-10-22 DIAGNOSIS — Z808 Family history of malignant neoplasm of other organs or systems: Secondary | ICD-10-CM

## 2020-10-22 DIAGNOSIS — Z8042 Family history of malignant neoplasm of prostate: Secondary | ICD-10-CM

## 2020-10-22 DIAGNOSIS — Z803 Family history of malignant neoplasm of breast: Secondary | ICD-10-CM

## 2020-10-22 DIAGNOSIS — Z8481 Family history of carrier of genetic disease: Secondary | ICD-10-CM

## 2020-10-22 LAB — GENETIC SCREENING ORDER

## 2020-10-23 ENCOUNTER — Encounter: Payer: Self-pay | Admitting: Genetic Counselor

## 2020-10-23 DIAGNOSIS — Z8042 Family history of malignant neoplasm of prostate: Secondary | ICD-10-CM

## 2020-10-23 DIAGNOSIS — Z800 Family history of malignant neoplasm of digestive organs: Secondary | ICD-10-CM

## 2020-10-23 DIAGNOSIS — Z803 Family history of malignant neoplasm of breast: Secondary | ICD-10-CM

## 2020-10-23 DIAGNOSIS — Z8481 Family history of carrier of genetic disease: Secondary | ICD-10-CM | POA: Insufficient documentation

## 2020-10-23 DIAGNOSIS — Z808 Family history of malignant neoplasm of other organs or systems: Secondary | ICD-10-CM | POA: Insufficient documentation

## 2020-10-23 HISTORY — DX: Family history of malignant neoplasm of prostate: Z80.42

## 2020-10-23 HISTORY — DX: Family history of malignant neoplasm of digestive organs: Z80.0

## 2020-10-23 HISTORY — DX: Family history of malignant neoplasm of other organs or systems: Z80.8

## 2020-10-23 HISTORY — DX: Family history of malignant neoplasm of breast: Z80.3

## 2020-10-23 NOTE — Progress Notes (Signed)
REFERRING PROVIDER: No referring provider defined for this encounter.  PRIMARY PROVIDER:  No PCP listed  PRIMARY REASON FOR VISIT:  1. Family history of BRCA2 gene positive   2. Family history of breast cancer   3. Family history of prostate cancer   4. Family history of malignant neoplasm of digestive organ   5. Family history of skin cancer    HISTORY OF PRESENT ILLNESS:   Zachary Mack, a 40 y.o. male, was seen for a [place] cancer genetics consultation due to a family history of cancer and a maternal family history of a BRCA2 mutation.  Zachary Mack presents to clinic today to discuss the possibility of a hereditary predisposition to cancer, to discuss genetic testing, and to further clarify his future cancer risks, as well as potential cancer risks for family members.   Zachary Mack is a 40 y.o. male with no personal history of cancer.    CANCER HISTORY:  Oncology History   No history exists.     RISK FACTORS:  PSA screening: no Colonoscopy: no; not examined.  No dermatology.  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 Depression   . Family history of breast cancer 10/23/2020  . Family history of malignant neoplasm of digestive organ 10/23/2020  . Family history of prostate cancer 10/23/2020  . Family history of skin cancer 10/23/2020  . Kidney stone     No past surgical history on file.  Social History   Socioeconomic History  . Marital status: Married    Spouse name: Not on file  . Number of children: Not on file  . Years of education: Not on file  . Highest education level: Not on file  Occupational History  . Not on file  Tobacco Use  . Smoking status: Never Smoker  . Smokeless tobacco: Never Used  Vaping Use  . Vaping Use: Never used  Substance and Sexual Activity  . Alcohol use: No  . Drug use: Yes    Types: Marijuana  . Sexual activity: Not on file  Other Topics Concern  . Not on file   Social History Narrative  . Not on file   Social Determinants of Health   Financial Resource Strain: Not on file  Food Insecurity: Not on file  Transportation Needs: Not on file  Physical Activity: Not on file  Stress: Not on file  Social Connections: Not on file     FAMILY HISTORY:  We obtained a detailed, 4-generation family history.  Significant diagnoses are listed below: Family History  Problem Relation Age of Onset  . Other Mother        BRCA2 mutation  . Prostate cancer Father        dx 63s  . Breast cancer Maternal Aunt 57  . Skin cancer Paternal Grandfather        scalp  . Colon cancer Other        MGM's sister, dx after 68  . Breast cancer Maternal Aunt 48  . Breast cancer Other        MGF's sister; dx 44s  . Breast cancer Other        MGM's mother; dx early 72s  . Stomach cancer Other        MGM's father; dx early 49s    Zachary Mack has one son, age 68, and one daughter, age 65.  He has one sister, Ria Comment, age 57, who tested negative  for the familial BRCA2 mutation.    Zachary Mack mother had genetic testing in February 2022 through the [facility]. A single, heterozygous pathogenic variant was detected in the BRCA2 gene called p.[id number].  There were no deleterious or uncertain variants detected in other genes. The CancerNext-Expanded gene panel offered by [facility] and includes sequencing, rearrangement, and RNA analysis for the following 77 genes: AIP, ALK, APC, ATM, AXIN2, BAP1, BARD1, BLM, BMPR1A, BRCA1, BRCA2, BRIP1, CDC73, CDH1, CDK4, CDKN1B, CDKN2A, CHEK2, CTNNA1, DICER1, FANCC, FH, FLCN, GALNT12, KIF1B, LZTR1, MAX, MEN1, MET, MLH1, MSH2, MSH3, MSH6, MUTYH, NBN, NF1, NF2, NTHL1, PALB2, PHOX2B, PMS2, POT1, PRKAR1A, PTCH1, PTEN, RAD51C, RAD51D, RB1, RECQL, RET, SDHA, SDHAF2, SDHB, SDHC, SDHD, SMAD4, SMARCA4, SMARCB1, SMARCE1, STK11, SUFU, TMEM127, TP53, TSC1, TSC2, VHL and XRCC2 (sequencing and deletion/duplication);  EGFR, EGLN1, HOXB13, KIT, MITF, PDGFRA, POLD1, and POLE (sequencing only); EPCAM and GREM1 (deletion/duplication only).  Zachary Mack has two maternal aunts, now deceased, who had breast cancer before the age of 39.  He has several other maternal relatives with colon cancer, breast cancer, and gastric cancer histories.   Zachary Mack father is 6 and had breast cancer diagnosed in his 82s.  Zachary Mack paternal grandfather had skin cancer on his scalp.  No other paternal family history of cancer was reported.   Zachary Mack is unaware of previous family history of genetic testing for hereditary cancer risks besides that mentioned above. Patient's maternal ancestors are of White/Caucasian descent, and paternal ancestors are of English descent. There is no reported Ashkenazi Jewish ancestry. There is no known consanguinity.  GENETIC COUNSELING ASSESSMENT: Zachary Mack is a 40 y.o. male with a family history of cancer and a family history of a known hereditary cancer syndrome. We, therefore, discussed and recommended the following at today's visit.   DISCUSSION: We discussed that 5 - 10% of cancer is hereditary, with most cases of hereditary breast cancer associated with mutations in BRCA1/2.  Given that his mother has a mutation in BRCA2, we discussed that there is a 50% chance that he carries the same mutation.  We reviewed the cancer risks, management strategies, and family implications for those with BRCA2 mutations. There are other genes that can be associated with hereditary breast and prostate cancer syndromes.  We discussed that testing is beneficial for several reasons, including knowing about other cancer risks, identifying potential screening and risk-reduction options that may be appropriate, and to understanding if other family members could be at risk for cancer and allowing them to undergo genetic testing.  We also discussed genetic testing, including the appropriate family members to test, the  process of testing and turn-around-time for results. We discussed the implications of a negative and positive result. We recommended Zachary Mack pursue genetic testing for single site analysis for the BRCA2 mutation in the family through [employer] [facility].   Based on Zachary Mack's family history of cancer and a known BRCA2 mutation in the family, he meets medical criteria for genetic testing. We discussed that he is not expected to have an out of pocket cost for testing, given that [facility] offers no charge follow-up testing for family members within 23 days of their family member's report date.   We discussed that some people do not want to undergo genetic testing due to fear of genetic discrimination.  A federal law called the Genetic Information Non-Discrimination Act (GINA) of 2008 helps protect individuals against genetic discrimination based on their genetic test results.  It impacts both  health insurance and employment.  With health insurance, it protects against increased premiums, being kicked off insurance or being forced to take a test in order to be insured.  For employment it protects against hiring, firing and promoting decisions based on genetic test results.  GINA does not apply to those in the [employer], those who work for companies with less than 15 employees, and new life insurance or long-term disability insurance policies.  Health status due to a cancer diagnosis is not protected under GINA.  PLAN: After considering the risks, benefits, and limitations, Zachary Mack provided informed consent to pursue genetic testing and the blood sample was sent to [facility] for analysis of the familial BRCA2 mutation. Results should be available within approximately 3 weeks' time, at which point they will be disclosed by telephone to Zachary Mack, as will any additional recommendations warranted by these results. Zachary Mack will receive a summary of his genetic counseling visit and a copy of his results once  available. This information will also be available in Epic.   Lastly, we encouraged Zachary Mack to remain in contact with cancer genetics annually so that we can continuously update the family history and inform him of any changes in cancer genetics and testing that may be of benefit for this family.   Zachary Mack questions were answered to his satisfaction today. Our contact information was provided should additional questions or concerns arise. Thank you for the referral and allowing [place] to share in the care of your patient.   Tyona Nilsen M. Joette Catching, [place], [facility] Genetic Counselor [email or web address] (P) [phone]   The patient was seen for a total of 25 minutes in face-to-face genetic counseling.  Drs. Magrinat, Lindi Adie and/or Burr Medico were available to discuss this case as needed.  _______________________________________________________________________ For Office Staff:  Number of people involved in session: 1 Was an Intern/ student involved with case: no

## 2020-11-03 ENCOUNTER — Encounter: Payer: Self-pay | Admitting: Emergency Medicine

## 2020-11-03 ENCOUNTER — Telehealth: Payer: 59 | Admitting: Emergency Medicine

## 2020-11-03 DIAGNOSIS — U071 COVID-19: Secondary | ICD-10-CM | POA: Diagnosis not present

## 2020-11-03 DIAGNOSIS — H9202 Otalgia, left ear: Secondary | ICD-10-CM | POA: Diagnosis not present

## 2020-11-03 MED ORDER — AMOXICILLIN-POT CLAVULANATE 875-125 MG PO TABS: 1.0000 | ORAL_TABLET | Freq: Two times a day (BID) | ORAL | 0 refills | Status: DC

## 2020-11-03 NOTE — Progress Notes (Signed)
Zachary Mack, Zachary Mack are scheduled for a virtual visit with your provider today.    Just as we do with appointments in the office, we must obtain your consent to participate.  Your consent will be active for this visit and any virtual visit you may have with one of our providers in the next 365 days.    If you have a MyChart account, I can also send a copy of this consent to you electronically.  All virtual visits are billed to your insurance company just like a traditional visit in the office.  As this is a virtual visit, video technology does not allow for your provider to perform a traditional examination.  This may limit your provider's ability to fully assess your condition.  If your provider identifies any concerns that need to be evaluated in person or the need to arrange testing such as labs, EKG, etc, we will make arrangements to do so.    Although advances in technology are sophisticated, we cannot ensure that it will always work on either your end or our end.  If the connection with a video visit is poor, we may have to switch to a telephone visit.  With either a video or telephone visit, we are not always able to ensure that we have a secure connection.   I need to obtain your verbal consent now.   Are you willing to proceed with your visit today?   Zachary Mack has provided verbal consent on 11/03/2020 for a virtual visit (video or telephone).   Zachary Gens, PA-C 11/03/2020  12:30 PM   Date:  11/03/2020   ID:  Zachary Mack, DOB 16-Nov-1980, MRN [id number]  Patient Location: Home Provider Location: Home Office   Participants: Patient and Provider for Visit and Wrap up  Method of visit: Video  Location of Patient: Home Location of Provider: Home Office Consent was obtain for visit over the video. Services rendered by provider: Visit was performed via video  A video enabled telemedicine application was used and I verified that I am speaking with the correct person using two  identifiers.  PCP:  Patient, No Pcp Per (Inactive)   Chief Complaint:  COVID positive 3 days ago  History of Present Illness:    Zachary Mack is a 40 y.o. male with history as stated below. Presents video telehealth for an acute care visit  Onset of symptoms was 3 days ago, started with  extreme chills, low-grade fever, body aches for 1 day, developed into a productive cough, mild generalized headache, sinus congestion and fatigue.  Slight nausea and diarrhea yesterday.  Temporary tinnitus in Left ear with a mild dull ache c/w prior ear infections.   He reports exposure to several co-workers who tested positive for [place].  Pt took several home tests, which were positive.    He reports testing positive for COVID Jul 11, 2020- fatigue, back aches, low-grade fever, manageable.   Vaccinated April/May 2021.    Denies having shortness of breath, chest pain, sore throat, vomiting, dizziness, change in vision or balance. Denies ear drainage. No hx of cerumen impaction. Denies facial or dental pain.   Modifying factors include: Vit C, D, zinc   No other aggravating or relieving factors.  No other c/o.  Past Medical, Surgical, Social History, Allergies, and Medications have been Reviewed.  Patient Active Problem List   Diagnosis Date Noted  . Family history of breast cancer 10/23/2020  . Family history of prostate cancer 10/23/2020  .  Family history of malignant neoplasm of digestive organ 10/23/2020  . Family history of skin cancer 10/23/2020  . Family history of BRCA2 gene positive 10/23/2020    Social History   Tobacco Use  . Smoking status: Never Smoker  . Smokeless tobacco: Never Used  Substance Use Topics  . Alcohol use: No     Current Outpatient Medications:  .  amoxicillin-clavulanate (AUGMENTIN) 875-125 MG tablet, Take 1 tablet by mouth 2 (two) times daily. One po bid x 7 days, Disp: 14 tablet, Rfl: 0   No Known Allergies   ROS See HPI for history of present  illness.  Physical Exam Constitutional:      General: He is not in acute distress.    Appearance: Normal appearance. He is normal weight. He is not ill-appearing, toxic-appearing or diaphoretic.  HENT:     Head: Normocephalic.  Pulmonary:     Effort: Pulmonary effort is normal. No respiratory distress.  Musculoskeletal:     Cervical back: Normal range of motion.  Neurological:     Mental Status: He is alert.  Psychiatric:        Mood and Affect: Mood normal.        Behavior: Behavior normal.               A&P  1. Left Otalgia   -Preceded by 3 days of sinus congestion, chills, low-grade fever  -Augmentin for suspected AOM 2. COVID-19   -Referral to [facility] placed  -Encouraged symptomatic tx  -Discussed signs/symptoms that warrant in-person evaluation and treatment     Patient voiced understanding and agreement to plan.   Time:   Today, I have spent 15 minutes with the patient with telehealth technology discussing the above problems, reviewing the chart, previous notes, medications and orders.    Tests Ordered: Orders Placed This Encounter  Procedures  . Ambulatory referral for Covid Treatment    Medication Changes: Meds ordered this encounter  Medications  . amoxicillin-clavulanate (AUGMENTIN) 875-125 MG tablet    Sig: Take 1 tablet by mouth 2 (two) times daily. One po bid x 7 days    Dispense:  14 tablet    Refill:  0     Disposition:  Follow up primary care or urgent care if needed.  Etta Grandchild, PA-C  11/03/2020 12:30 PM

## 2020-11-04 ENCOUNTER — Telehealth (HOSPITAL_COMMUNITY): Payer: Self-pay

## 2020-11-04 NOTE — Telephone Encounter (Signed)
Called to discuss with patient about Covid symptoms and possible treatment options available for those with mild to moderate Covid symptoms that are at high risk of hospitalization.     Pt may be qualified for this infusion due to co-morbid conditions and/or a member of an at-risk group.     Patient declines infusion at this time, stating he is feeling better. Symptoms tier reviewed as well as criteria for ending isolation. Preventative practices reviewed. Patient verbalized understanding.   Patient advised to call back if he/she decides that he/she does want treatment. Callback number to the infusion center given [phone]. Patient advised to go to Urgent care or ED with severe symptoms.    Essie Hart, RN

## 2020-11-11 ENCOUNTER — Encounter: Payer: Self-pay | Admitting: Genetic Counselor

## 2020-11-11 ENCOUNTER — Telehealth: Payer: Self-pay | Admitting: Genetic Counselor

## 2020-11-11 ENCOUNTER — Ambulatory Visit: Payer: Self-pay | Admitting: Genetic Counselor

## 2020-11-11 DIAGNOSIS — Z808 Family history of malignant neoplasm of other organs or systems: Secondary | ICD-10-CM

## 2020-11-11 DIAGNOSIS — Z1509 Genetic susceptibility to other malignant neoplasm: Secondary | ICD-10-CM

## 2020-11-11 DIAGNOSIS — Z8042 Family history of malignant neoplasm of prostate: Secondary | ICD-10-CM

## 2020-11-11 DIAGNOSIS — Z1379 Encounter for other screening for genetic and chromosomal anomalies: Secondary | ICD-10-CM | POA: Insufficient documentation

## 2020-11-11 DIAGNOSIS — Z800 Family history of malignant neoplasm of digestive organs: Secondary | ICD-10-CM

## 2020-11-11 DIAGNOSIS — Z1501 Genetic susceptibility to malignant neoplasm of breast: Secondary | ICD-10-CM | POA: Insufficient documentation

## 2020-11-11 DIAGNOSIS — Z8481 Family history of carrier of genetic disease: Secondary | ICD-10-CM

## 2020-11-11 DIAGNOSIS — Z803 Family history of malignant neoplasm of breast: Secondary | ICD-10-CM

## 2020-11-11 HISTORY — DX: Genetic susceptibility to malignant neoplasm of breast: Z15.01

## 2020-11-11 HISTORY — DX: Genetic susceptibility to other malignant neoplasm: Z15.09

## 2020-11-11 NOTE — Progress Notes (Signed)
GENETIC TEST RESULTS  Patient Name: Zachary Mack Patient Age: 40 y.o. Encounter Date: 11/11/2020  Referring Provider: None for this encounter   Zachary Mack was seen in the [facility] clinic on October 22, 2020 due to a family history of cancer and a family history of a known hereditary predisposition to cancer (BRCA2 mutation in mother). Please refer to the prior Genetics clinic note for more information regarding Zachary Mack's medical and family histories and our assessment at the time.   FAMILY HISTORY:  We obtained a detailed, 4-generation family history.  Significant diagnoses are listed below: Family History  Problem Relation Age of Onset  . Other Mother        BRCA2 mutation  . Prostate cancer Father        dx 2s  . Breast cancer Maternal Aunt 69  . Skin cancer Paternal Grandfather        scalp  . Colon cancer Other        MGM's sister, dx after 4  . Breast cancer Maternal Aunt 48  . Breast cancer Other        MGF's sister; dx 1s  . Breast cancer Other        MGM's mother; dx early 64s  . Stomach cancer Other        MGM's father; dx early 34s    Zachary Mack has one son, age 59, and one daughter, age 86.  He has one sister, Ria Comment, age 26, who tested negative for the familial BRCA2 mutation.    Zachary Mack mother had genetic testing in February 2022through the [facility] CancerNext-ExpandedCancer Panel +RNAinsight. A single, heterozygous pathogenic variant was detected in theBRCA2gene called p.D2723E.  There were no deleteriousor uncertain variants detected in other genes.The CancerNext-Expanded gene panel offered by [facility] and includes sequencing, rearrangement, and RNA analysis for the following 77 genes: AIP, ALK, APC, ATM, AXIN2, BAP1, BARD1, BLM, BMPR1A, BRCA1, BRCA2, BRIP1, CDC73, CDH1, CDK4, CDKN1B, CDKN2A, CHEK2, CTNNA1, DICER1, FANCC, FH, FLCN, GALNT12, KIF1B, LZTR1, MAX, MEN1, MET, MLH1, MSH2, MSH3, MSH6, MUTYH, NBN, NF1, NF2, NTHL1, PALB2, PHOX2B,  PMS2, POT1, PRKAR1A, PTCH1, PTEN, RAD51C, RAD51D, RB1, RECQL, RET, SDHA, SDHAF2, SDHB, SDHC, SDHD, SMAD4, SMARCA4, SMARCB1, SMARCE1, STK11, SUFU, TMEM127, TP53, TSC1, TSC2, VHL and XRCC2(sequencing and deletion/duplication); EGFR, EGLN1, HOXB13, KIT, MITF, PDGFRA, POLD1, and POLE(sequencing only); EPCAM and GREM1(deletion/duplication only).  Zachary Mack has two maternal aunts, now deceased, who had breast cancer before the age of 44.  He has several other maternal relatives with colon cancer, breast cancer, and gastric cancer histories.   Zachary Mack father is 14 and had breast cancer diagnosed in his 84s.  Zachary Mack paternal grandfather had skin cancer on his scalp.  No other paternal family history of cancer was reported.   Zachary Mack is unaware of previous family history of genetic testing for hereditary cancer risks besides that mentioned above. Patient's maternal ancestors are of White/Caucasian descent, and paternal ancestors are of English descent. There is no reported Ashkenazi Jewish ancestry. There is no known consanguinity.  GENETIC TESTING: At the time of Zachary Mack's visit, we recommended he pursue genetic testing for the specific BRCA2 mutation that was identified in the family. 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  Testing was performed by Althia Forts and included site specific analysis of this familial mutation only.   The test report will be scanned into EPIC and located  under the Molecular Pathology section of the Results Review tab.  A portion of the result report is included below for reference.      CANCER RISKS: Both females and males with a BRCA2 mutation are at an increased risk for cancer. Studies show that females with a BRCA2 mutation can have a 61-77% lifetime risk to develop breast cancer, up to a 26% risk to develop a second breast cancer within 20  years, and up to a 11-25% risk to develop ovarian cancer. Males can have a 7-8% lifetime risk to develop male breast cancer and a 20-30% risk for prostate cancer. Both males and females can also have a 3-5% increased risk for pancreatic cancer and a 3-5% increased risk for melanoma.  CANCER RISK REDUCTION & SCREENING RECOMMENDATIONS:   The [employer] (NCCN) recommends the following for males who carry a BRCA2 mutation: Breast self-exam training and education starting at age 63 years Clinical breast exam, every 12 months, starting at age 40 years Consider annual mammogram screening in men with gynecomastia starting at age 55, or 72 years before the earliest known male breast cancer in the family (whichever comes first) Prostate cancer screening starting at age 43 years (PSA and digital rectal exam)  The following is recommended for both males and females who carry a BRCA2 mutation: Consideration of pancreatic cancer screening if there is a family history of pancreatic cancer General melanoma risk management, such as annual full-body skin examination and minimizing UV exposure Consider investigational imaging and screening studies, when available (e.g. novel imaging technologies, more frequent screening intervals) in the context of a clinical trial   FAMILY MEMBERS: It is important that all of Zachary Mack's relatives (both men and women) know of the presence of this gene mutation. Site-specific genetic testing can sort out who in the family is at risk and who is not.   Zachary Mack children are have a 50% chance to have inherited this mutation. However, they are relatively young and this will not be of any consequence to them for several years. We do not test children because there is no risk to them until they are adults. We recommend they have genetic counseling and testing by the time they are in their early 20s.    Zachary Mack stated that he is interested in having more  children in the future. Individuals with a pathogenic variant in BRCA2 are carriers of Fanconi anemia. Fanconi anemia is an autosomal recessive disorder that is characterized by bone marrow failure and variable presentation of anomalies, including short stature, abnormal skin pigmentation, abnormal thumbs, malformations of the skeletal and central nervous systems, and developmental delay. Risks for leukemia and early onset solid tumors are significantly elevated. For there to be a risk of Fanconi anemia in offspring, both parents would each have to have a single pathogenic variant in BRCA2; in such a case, the risk of having an affected child is 25%.  PLAN:  Zachary Mack does not have a primary care provider at this time. He plans to schedule an appointment with a primary care provider in the near future to discuss chest wall exams, prostate cancer screening, and other management recommendations.   Zachary Mack does not have a dermatologist at this time.  He plans to schedule an appointment with a dermatologist to complete annual skin checks.  SUPPORT AND RESOURCES: If Zachary Mack is interested in BRCA-specific information and support, some people have found Facing Our Risk (www.facingourrisk.com) helpful. They provide opportunities to  speak with other individuals from high-risk families. To locate genetic counselors in other cities, visit the website of the [employer] (ArtistMovie.se) and Secretary/administrator for a Social worker by zip code.  We encouraged Mr. Wamble to remain in contact with [place] on an annual basis so we can update his personal and family histories, and let him know of advances in cancer genetics that may benefit the family. Our contact number was provided. Zachary Mack questions were answered to his satisfaction today, and he knows he is welcome to call anytime with additional questions.   Zachary Mack M. Joette Catching, [place], [facility] Genetic Counselor [email or web address] (P) [phone]

## 2020-11-11 NOTE — Telephone Encounter (Signed)
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  Reviewed cancer risks, management strategies, and implications for family members.

## 2022-10-30 ENCOUNTER — Emergency Department (HOSPITAL_COMMUNITY)
Admission: EM | Admit: 2022-10-30 | Discharge: 2022-10-30 | Disposition: A | Discharge status: Home / Self Care | Payer: No Typology Code available for payment source | Attending: Emergency Medicine | Admitting: Emergency Medicine

## 2022-10-30 ENCOUNTER — Emergency Department (HOSPITAL_COMMUNITY): Payer: No Typology Code available for payment source

## 2022-10-30 ENCOUNTER — Encounter (HOSPITAL_COMMUNITY): Payer: Self-pay

## 2022-10-30 ENCOUNTER — Other Ambulatory Visit: Payer: Self-pay

## 2022-10-30 DIAGNOSIS — N200 Calculus of kidney: Secondary | ICD-10-CM | POA: Insufficient documentation

## 2022-10-30 DIAGNOSIS — N23 Unspecified renal colic: Secondary | ICD-10-CM

## 2022-10-30 DIAGNOSIS — R109 Unspecified abdominal pain: Secondary | ICD-10-CM | POA: Diagnosis present

## 2022-10-30 LAB — I-STAT CHEM 8, ED
BUN: 23 mg/dL — ABNORMAL HIGH (ref 6–20)
Calcium, Ion: 1.17 mmol/L (ref 1.15–1.40)
Chloride: 106 mmol/L (ref 98–111)
Creatinine, Ser: 1.5 mg/dL — ABNORMAL HIGH (ref 0.61–1.24)
Glucose, Bld: 109 mg/dL — ABNORMAL HIGH (ref 70–99)
HCT: 43 % (ref 39.0–52.0)
Hemoglobin: 14.6 g/dL (ref 13.0–17.0)
Potassium: 3.5 mmol/L (ref 3.5–5.1)
Sodium: 141 mmol/L (ref 135–145)
TCO2: 26 mmol/L (ref 22–32)

## 2022-10-30 LAB — CBC
HCT: 42.1 % (ref 39.0–52.0)
Hemoglobin: 14.6 g/dL (ref 13.0–17.0)
MCH: 32.7 pg (ref 26.0–34.0)
MCHC: 34.7 g/dL (ref 30.0–36.0)
MCV: 94.2 fL (ref 80.0–100.0)
Platelets: 230 10*3/uL (ref 150–400)
RBC: 4.47 MIL/uL (ref 4.22–5.81)
RDW: 12.8 % (ref 11.5–15.5)
WBC: 14.9 10*3/uL — ABNORMAL HIGH (ref 4.0–10.5)
nRBC: 0 % (ref 0.0–0.2)

## 2022-10-30 MED ORDER — ONDANSETRON HCL 4 MG/2ML IJ SOLN
4.0000 mg | Freq: Once | INTRAMUSCULAR | Status: AC
  Administered 2022-10-30: 4 mg via INTRAVENOUS
  Filled 2022-10-30: qty 2

## 2022-10-30 MED ORDER — TAMSULOSIN HCL 0.4 MG PO CAPS: 0.4000 mg | ORAL_CAPSULE | Freq: Every day | ORAL | 0 refills | Status: DC

## 2022-10-30 MED ORDER — DICLOFENAC SODIUM ER 100 MG PO TB24: 100.0000 mg | ORAL_TABLET | Freq: Every day | ORAL | 0 refills | Status: DC

## 2022-10-30 MED ORDER — SODIUM CHLORIDE 0.9 % IV BOLUS
500.0000 mL | Freq: Once | INTRAVENOUS | Status: AC
  Administered 2022-10-30: 500 mL via INTRAVENOUS

## 2022-10-30 MED ORDER — TRAMADOL HCL 50 MG PO TABS: 50.0000 mg | ORAL_TABLET | Freq: Four times a day (QID) | ORAL | 0 refills | Status: DC | PRN

## 2022-10-30 MED ORDER — ONDANSETRON 4 MG PO TBDP
4.0000 mg | ORAL_TABLET | Freq: Once | ORAL | Status: AC
  Administered 2022-10-30: 4 mg via ORAL
  Filled 2022-10-30: qty 1

## 2022-10-30 MED ORDER — KETOROLAC TROMETHAMINE 30 MG/ML IJ SOLN
15.0000 mg | Freq: Once | INTRAMUSCULAR | Status: AC
  Administered 2022-10-30: 15 mg via INTRAVENOUS
  Filled 2022-10-30: qty 1

## 2022-10-30 MED ORDER — TAMSULOSIN HCL 0.4 MG PO CAPS
0.4000 mg | ORAL_CAPSULE | ORAL | Status: AC
  Administered 2022-10-30: 0.4 mg via ORAL
  Filled 2022-10-30: qty 1

## 2022-10-30 MED ORDER — MAGNESIUM SULFATE 2 GM/50ML IV SOLN
2.0000 g | Freq: Once | INTRAVENOUS | Status: AC
  Administered 2022-10-30: 2 g via INTRAVENOUS
  Filled 2022-10-30: qty 50

## 2022-10-30 MED ORDER — ONDANSETRON 8 MG PO TBDP: ORAL_TABLET | ORAL | 0 refills | Status: DC

## 2022-10-30 NOTE — ED Triage Notes (Signed)
Pt arrives with c/o left sided flank pain that started about an hour ago. Pt endorses n/v. Per pt, pain radiates into his ABD on the left side.

## 2022-10-30 NOTE — ED Provider Notes (Signed)
[place] EMERGENCY DEPARTMENT AT [facility] Provider Note   CSN: [id number] Arrival date & time: 10/30/22  0207     History  Chief Complaint  Patient presents with   Flank Pain    Zachary Mack is a 42 y.o. male.  The history is provided by the patient.  Flank Pain This is a recurrent problem. The current episode started 1 to 2 hours ago. The problem occurs constantly. The problem has not changed since onset.Pertinent negatives include no chest pain, no headaches and no shortness of breath. Nothing aggravates the symptoms. Nothing relieves the symptoms. He has tried nothing for the symptoms. The treatment provided no relief.  Patient with kidney stones with flank pain nausea and vomiting.       Home Medications Prior to Admission medications   Medication Sig Start Date End Date Taking? Authorizing Provider  amoxicillin-clavulanate (AUGMENTIN) 875-125 MG tablet Take 1 tablet by mouth 2 (two) times daily. One po bid x 7 days 11/03/20   Lurene Shadow, PA-C      Allergies    Patient has no known allergies.    Review of Systems   Review of Systems  Constitutional:  Negative for fever.  HENT:  Negative for congestion.   Eyes:  Negative for photophobia.  Respiratory:  Negative for shortness of breath.   Cardiovascular:  Negative for chest pain.  Genitourinary:  Positive for flank pain.  Neurological:  Negative for headaches.  All other systems reviewed and are negative.   Physical Exam Updated Vital Signs BP 125/70   Pulse 62   Temp (!) 97.4 F (36.3 C) (Oral)   Resp 18   Wt 72.6 kg   SpO2 99%   BMI 22.96 kg/m  Physical Exam Vitals and nursing note reviewed.  Constitutional:      General: He is not in acute distress.    Appearance: Normal appearance. He is well-developed. He is not diaphoretic.  HENT:     Head: Normocephalic and atraumatic.     Nose: Nose normal.  Eyes:     Conjunctiva/sclera: Conjunctivae normal.     Pupils: Pupils are  equal, round, and reactive to light.  Cardiovascular:     Rate and Rhythm: Normal rate and regular rhythm.     Pulses: Normal pulses.     Heart sounds: Normal heart sounds.  Pulmonary:     Effort: Pulmonary effort is normal.     Breath sounds: Normal breath sounds. No wheezing or rales.  Abdominal:     General: Bowel sounds are normal.     Palpations: Abdomen is soft.     Tenderness: There is no abdominal tenderness. There is no guarding or rebound.  Musculoskeletal:        General: Normal range of motion.     Cervical back: Normal range of motion and neck supple.  Skin:    General: Skin is warm and dry.     Capillary Refill: Capillary refill takes less than 2 seconds.  Neurological:     General: No focal deficit present.     Mental Status: He is alert and oriented to person, place, and time.     Deep Tendon Reflexes: Reflexes normal.  Psychiatric:        Mood and Affect: Mood normal.        Behavior: Behavior normal.     ED Results / Procedures / Treatments   Labs (all labs ordered are listed, but only abnormal results are displayed) Results for  orders placed or performed during the hospital encounter of 10/30/22  CBC  Result Value Ref Range   WBC 14.9 (H) 4.0 - 10.5 K/uL   RBC 4.47 4.22 - 5.81 MIL/uL   Hemoglobin 14.6 13.0 - 17.0 g/dL   HCT 40.9 81.1 - 91.4 %   MCV 94.2 80.0 - 100.0 fL   MCH 32.7 26.0 - 34.0 pg   MCHC 34.7 30.0 - 36.0 g/dL   RDW 78.2 95.6 - 21.3 %   Platelets 230 150 - 400 K/uL   nRBC 0.0 0.0 - 0.2 %  I-stat chem 8, ED (not at [facility], [facility] or [facility])  Result Value Ref Range   Sodium 141 135 - 145 mmol/L   Potassium 3.5 3.5 - 5.1 mmol/L   Chloride 106 98 - 111 mmol/L   BUN 23 (H) 6 - 20 mg/dL   Creatinine, Ser 0.86 (H) 0.61 - 1.24 mg/dL   Glucose, Bld 578 (H) 70 - 99 mg/dL   Calcium, Ion 4.69 6.29 - 1.40 mmol/L   TCO2 26 22 - 32 mmol/L   Hemoglobin 14.6 13.0 - 17.0 g/dL   HCT 52.8 41.3 - 24.4 %   CT Renal Stone Study  Result Date:  10/30/2022 CLINICAL DATA:  Left flank pain. EXAM: CT ABDOMEN AND PELVIS WITHOUT CONTRAST TECHNIQUE: Multidetector CT imaging of the abdomen and pelvis was performed following the standard protocol without IV contrast. RADIATION DOSE REDUCTION: This exam was performed according to the departmental dose-optimization program which includes automated exposure control, adjustment of the mA and/or kV according to patient size and/or use of iterative reconstruction technique. COMPARISON:  Shallyn Constancio 30, 2006 FINDINGS: Lower chest: No acute abnormality. Hepatobiliary: No focal liver abnormality is seen. No gallstones, gallbladder wall thickening, or biliary dilatation. Pancreas: Unremarkable. No pancreatic ductal dilatation or surrounding inflammatory changes. Spleen: Normal in size without focal abnormality. Adrenals/Urinary Tract: Adrenal glands are unremarkable. Kidneys are normal in size, without focal lesions. A 2 mm nonobstructing renal calculus is seen within the lower pole of the right kidney. An 8 mm obstructing renal calculus is seen at the left UVJ with moderate severity left-sided hydronephrosis and hydroureter. Bladder is unremarkable. Stomach/Bowel: Stomach is within normal limits. Appendix appears normal. No evidence of bowel wall thickening, distention, or inflammatory changes. Noninflamed diverticula are seen within the ascending colon. Vascular/Lymphatic: No significant vascular findings are present. No enlarged abdominal or pelvic lymph nodes. Reproductive: Prostate is unremarkable. Other: No abdominal wall hernia or abnormality. No abdominopelvic ascites. Musculoskeletal: No acute or significant osseous findings. IMPRESSION: 1. 8 mm obstructing renal calculus at the left UVJ. 2. 2 mm nonobstructing right renal calculus. 3. Colonic diverticulosis. Electronically Signed   By: Aram Candela M.D.   On: 10/30/2022 03:12      Radiology CT Renal Stone Study  Result Date: 10/30/2022 CLINICAL DATA:  Left  flank pain. EXAM: CT ABDOMEN AND PELVIS WITHOUT CONTRAST TECHNIQUE: Multidetector CT imaging of the abdomen and pelvis was performed following the standard protocol without IV contrast. RADIATION DOSE REDUCTION: This exam was performed according to the departmental dose-optimization program which includes automated exposure control, adjustment of the mA and/or kV according to patient size and/or use of iterative reconstruction technique. COMPARISON:  Zaul Hubers 30, 2006 FINDINGS: Lower chest: No acute abnormality. Hepatobiliary: No focal liver abnormality is seen. No gallstones, gallbladder wall thickening, or biliary dilatation. Pancreas: Unremarkable. No pancreatic ductal dilatation or surrounding inflammatory changes. Spleen: Normal in size without focal abnormality. Adrenals/Urinary Tract: Adrenal glands are unremarkable. Kidneys are normal  in size, without focal lesions. A 2 mm nonobstructing renal calculus is seen within the lower pole of the right kidney. An 8 mm obstructing renal calculus is seen at the left UVJ with moderate severity left-sided hydronephrosis and hydroureter. Bladder is unremarkable. Stomach/Bowel: Stomach is within normal limits. Appendix appears normal. No evidence of bowel wall thickening, distention, or inflammatory changes. Noninflamed diverticula are seen within the ascending colon. Vascular/Lymphatic: No significant vascular findings are present. No enlarged abdominal or pelvic lymph nodes. Reproductive: Prostate is unremarkable. Other: No abdominal wall hernia or abnormality. No abdominopelvic ascites. Musculoskeletal: No acute or significant osseous findings. IMPRESSION: 1. 8 mm obstructing renal calculus at the left UVJ. 2. 2 mm nonobstructing right renal calculus. 3. Colonic diverticulosis. Electronically Signed   By: Aram Candela M.D.   On: 10/30/2022 03:12    Procedures Procedures    Medications Ordered in ED Medications  ondansetron (ZOFRAN-ODT) disintegrating tablet 4  mg (4 mg Oral Given 10/30/22 0227)  sodium chloride 0.9 % bolus 500 mL (0 mLs Intravenous Stopped 10/30/22 0459)  ketorolac (TORADOL) 30 MG/ML injection 15 mg (15 mg Intravenous Given 10/30/22 0420)  magnesium sulfate IVPB 2 g 50 mL (0 g Intravenous Stopped 10/30/22 0442)  ondansetron (ZOFRAN) injection 4 mg (4 mg Intravenous Given 10/30/22 0420)  tamsulosin (FLOMAX) capsule 0.4 mg (0.4 mg Oral Given 10/30/22 0458)    ED Course/ Medical Decision Making/ A&P                             Medical Decision Making Patient with kidney stones with flank pain   Amount and/or Complexity of Data Reviewed External Data Reviewed: notes.    Details: Previous notes reviewed  Labs: ordered.    Details: White count elevated 14.9 normal hemoglobin 14.6, platelets normal.  Normal sodium 141, normal potassium 3.5, creatinine slight elevation 1.5  Radiology: ordered and independent interpretation performed.    Details: Stone at UVJ by me   Risk Prescription drug management. Risk Details: Sleeping soundly post medication. Feeling well.  Pain medication for home and instructed to follow up with urology due to size of stone.  Patient verbalizes understanding and agrees to follow up.      Final Clinical Impression(s) / ED Diagnoses Final diagnoses:  None   Return for intractable cough, coughing up blood, fevers > 100.4 unrelieved by medication, shortness of breath, intractable vomiting, chest pain, shortness of breath, weakness, numbness, changes in speech, facial asymmetry, abdominal pain, passing out, Inability to tolerate liquids or food, cough, altered mental status or any concerns. No signs of systemic illness or infection. The patient is nontoxic-appearing on exam and vital signs are within normal limits.  I have reviewed the triage vital signs and the nursing notes. Pertinent labs & imaging results that were available during my care of the patient were reviewed by me and considered in my medical decision  making (see chart for details). After history, exam, and medical workup I feel the patient has been appropriately medically screened and is safe for discharge home. Pertinent diagnoses were discussed with the patient. Patient was given return precautions.    Rx / DC Orders ED Discharge Orders     None         Dhilan Brauer, MD 10/30/22 [id number]

## 2022-11-01 ENCOUNTER — Other Ambulatory Visit: Payer: Self-pay | Admitting: Urology

## 2022-11-01 NOTE — Progress Notes (Signed)
Left message for patient to call for litho instructions.

## 2022-11-02 ENCOUNTER — Encounter (HOSPITAL_BASED_OUTPATIENT_CLINIC_OR_DEPARTMENT_OTHER): Payer: Self-pay | Admitting: Urology

## 2022-11-02 NOTE — Progress Notes (Signed)
Pt returning call regarding upcoming ESWL procedure 4/26. Reviewed allergies, medications, and history. Instructed pt to arrive 0600, NPO after midnight. Pt to hold diclofenac and NSAIDs/ASA products until after procedure. No marijuana or alcohol for 24 hrs prior. Reviewed pre-op instructions. Pt to bring blue folder and license/insurance day of. Spouse and/or parents to be driver. Pt verbalized understanding and states no further questions at this time.

## 2022-11-04 ENCOUNTER — Encounter (HOSPITAL_BASED_OUTPATIENT_CLINIC_OR_DEPARTMENT_OTHER)
Admission: RE | Disposition: A | Discharge status: Home / Self Care | Payer: Self-pay | Source: Home / Self Care | Attending: Urology

## 2022-11-04 ENCOUNTER — Ambulatory Visit (HOSPITAL_COMMUNITY): Payer: No Typology Code available for payment source

## 2022-11-04 ENCOUNTER — Ambulatory Visit (HOSPITAL_BASED_OUTPATIENT_CLINIC_OR_DEPARTMENT_OTHER)
Admission: RE | Admit: 2022-11-04 | Discharge: 2022-11-04 | Disposition: A | Discharge status: Home / Self Care | Payer: No Typology Code available for payment source | Attending: Urology | Admitting: Urology

## 2022-11-04 ENCOUNTER — Other Ambulatory Visit: Payer: Self-pay

## 2022-11-04 ENCOUNTER — Encounter (HOSPITAL_BASED_OUTPATIENT_CLINIC_OR_DEPARTMENT_OTHER): Payer: Self-pay | Admitting: Urology

## 2022-11-04 DIAGNOSIS — N202 Calculus of kidney with calculus of ureter: Secondary | ICD-10-CM | POA: Insufficient documentation

## 2022-11-04 DIAGNOSIS — Z79899 Other long term (current) drug therapy: Secondary | ICD-10-CM | POA: Diagnosis not present

## 2022-11-04 DIAGNOSIS — N135 Crossing vessel and stricture of ureter without hydronephrosis: Secondary | ICD-10-CM | POA: Insufficient documentation

## 2022-11-04 HISTORY — PX: EXTRACORPOREAL SHOCK WAVE LITHOTRIPSY: SHX1557

## 2022-11-04 SURGERY — LITHOTRIPSY, ESWL
Anesthesia: LOCAL | Laterality: Left

## 2022-11-04 MED ORDER — CIPROFLOXACIN HCL 500 MG PO TABS
ORAL_TABLET | ORAL | Status: AC
  Filled 2022-11-04: qty 1

## 2022-11-04 MED ORDER — DIPHENHYDRAMINE HCL 25 MG PO CAPS
ORAL_CAPSULE | ORAL | Status: AC
  Filled 2022-11-04: qty 1

## 2022-11-04 MED ORDER — DIAZEPAM 5 MG PO TABS
ORAL_TABLET | ORAL | Status: AC
  Filled 2022-11-04: qty 2

## 2022-11-04 MED ORDER — DIPHENHYDRAMINE HCL 25 MG PO CAPS
25.0000 mg | ORAL_CAPSULE | ORAL | Status: AC
  Administered 2022-11-04: 25 mg via ORAL

## 2022-11-04 MED ORDER — TRAMADOL HCL 50 MG PO TABS: 50.0000 mg | ORAL_TABLET | Freq: Four times a day (QID) | ORAL | 0 refills | Status: DC | PRN

## 2022-11-04 MED ORDER — CIPROFLOXACIN HCL 500 MG PO TABS
500.0000 mg | ORAL_TABLET | ORAL | Status: AC
  Administered 2022-11-04: 500 mg via ORAL

## 2022-11-04 MED ORDER — DIAZEPAM 5 MG PO TABS
10.0000 mg | ORAL_TABLET | ORAL | Status: AC
  Administered 2022-11-04: 10 mg via ORAL

## 2022-11-04 MED ORDER — SODIUM CHLORIDE 0.9 % IV SOLN
INTRAVENOUS | Status: DC
  Administered 2022-11-04: 1000 mL via INTRAVENOUS

## 2022-11-04 NOTE — H&P (Signed)
CC: I have ureteral stone.  HPI: Zachary Mack is a 42 year-old male patient who is here for ureteral stone.  The problem is on the left side.   10/31/22: Zachary Mack is a 42 yo male who had the onset over the weekend of severe left flank pain with N/V and was seen in the ER where he was found to have an 8mm obstructing LUVJ stone. He had transient pain about 6 weeks ago. He was seen by [place] about 20 years ago and passed a stone then. He has had no hematuria. He has some bladder irritation. He had urgency but it got better this afternoon. He is on tamsulosin from the ER.      ALLERGIES: No Allergies    MEDICATIONS: Tamsulosin Hcl 0.4 mg capsule  Diclofenac Sodium  Ondansetron Hcl  Tramadol Hcl 50 mg tablet     GU PSH: No GU PSH    NON-GU PSH: No Non-GU PSH    GU PMH: None   NON-GU PMH: Anxiety Depression    FAMILY HISTORY: Kidney Stones - Runs in Family Prostate Cancer - Father   SOCIAL HISTORY: Marital Status: Married Preferred Language: English; Ethnicity: Not Hispanic Or Latino; Race: White Current Smoking Status: Patient has never smoked.   Tobacco Use Assessment Completed: Used Tobacco in last 30 days? Does not use smokeless tobacco. Drinks 2 drinks per day.  Does not use drugs. Drinks 4+ caffeinated drinks per day. Patient's occupation Public affairs consultant.    REVIEW OF SYSTEMS:    GU Review Male:   Patient reports burning/ pain with urination, stream starts and stops, trouble starting your stream, and have to strain to urinate . Patient denies frequent urination, hard to postpone urination, get up at night to urinate, leakage of urine, erection problems, and penile pain.  Gastrointestinal (Upper):   Patient reports nausea and vomiting. Patient denies indigestion/ heartburn.  Gastrointestinal (Lower):   Patient reports constipation. Patient denies diarrhea.  Constitutional:   Patient reports fatigue. Patient denies fever, night sweats, and weight loss.  Skin:    Patient denies skin rash/ lesion and itching.  Eyes:   Patient denies blurred vision and double vision.  Ears/ Nose/ Throat:   Patient denies sore throat and sinus problems.  Hematologic/Lymphatic:   Patient denies swollen glands and easy bruising.  Cardiovascular:   Patient denies leg swelling and chest pains.  Respiratory:   Patient denies cough and shortness of breath.  Endocrine:   Patient reports excessive thirst.   Musculoskeletal:   Patient reports back pain. Patient denies joint pain.  Neurological:   Patient reports dizziness. Patient denies headaches.  Psychologic:   Patient reports anxiety. Patient denies depression.   VITAL SIGNS:      10/31/2022 03:16 PM  Weight 164 lb / 74.39 kg  Height 70 in / 177.8 cm  BP 109/70 mmHg  Heart Rate 60 /min  Temperature 98.7 F / 37.0 C  BMI 23.5 kg/m   MULTI-SYSTEM PHYSICAL EXAMINATION:    Constitutional: Well-nourished. No physical deformities. Normally developed. Good grooming.  Neck: Neck symmetrical, not swollen. Normal tracheal position.  Respiratory: Normal breath sounds. No labored breathing, no use of accessory muscles.   Cardiovascular: Regular rate and rhythm. No murmur, no gallop.   Skin: No paleness, no jaundice, no cyanosis. No lesion, no ulcer, no rash.  Neurologic / Psychiatric: Oriented to time, oriented to place, oriented to person. No depression, no anxiety, no agitation.  Gastrointestinal: No mass, no tenderness, no rigidity, non obese  abdomen.  Musculoskeletal: Normal gait and station of head and neck.     Complexity of Data:  Records Review:   Previous Hospital Records, Previous Patient Records  Urine Test Review:   Urinalysis  X-Ray Review: [facility] Protocol: Reviewed Films. Reviewed Report. Discussed With Patient.     PROCEDURES:         KUB - [id number]  A single view of the abdomen is obtained. There are no visible renal stones noted. There is a 4x52mm stone in the left pelvis in the area of the UVJ as noted on  CT. No bone, gas or soft tissue abnormalities are noted.       Patient confirmed No Neulasta OnPro Device.           Urinalysis Dipstick Dipstick Cont'd  Color: Yellow Bilirubin: Neg mg/dL  Appearance: Clear Ketones: Neg mg/dL  Specific Gravity: 4.098 Blood: Neg ery/uL  pH: 6.0 Protein: Neg mg/dL  Glucose: Neg mg/dL Urobilinogen: 0.2 mg/dL    Nitrites: Neg    Leukocyte Esterase: Neg leu/uL    ASSESSMENT:      ICD-10 Details  1 GU:   Ureteral calculus - N20.1 Left, Acute, Systemic Symptoms - He has a 4x63mm LUVJ stone with obstruction but his pain is controlled. I discussed continued MET, URS and ESWL and he would like to try ESWL. I will get him on for Friday.   I reviewed the risks of ESWL including bleeding, infection, injury to the kidney or adjacent structures, failure to fragment the stone, need for ancillary procedures, thrombotic events, cardiac arrhythmias and sedation complications.     2   Renal calculus - N20.0 Bilateral, Minor  3   Ureteral obstruction secondary to calculous - N13.2 Acute, Systemic Symptoms   PLAN:           Orders X-Rays: KUB          Schedule Return Visit/Planned Activity: ASAP - Schedule Surgery  Procedure: Approximately 4 Days - ESWL - 11914, left

## 2022-11-04 NOTE — Discharge Instructions (Signed)
See [facility] discharge instructions in chart.  

## 2022-11-04 NOTE — Op Note (Signed)
See [facility] OP note scanned into chart. Also because of the size, density, location and other factors that cannot be anticipated I feel this will likely be a staged procedure. This fact supersedes any indication in the scanned [facility] operative note to the contrary.  

## 2022-11-07 ENCOUNTER — Encounter (HOSPITAL_BASED_OUTPATIENT_CLINIC_OR_DEPARTMENT_OTHER): Payer: Self-pay | Admitting: Urology

## 2022-12-25 ENCOUNTER — Encounter (HOSPITAL_COMMUNITY): Payer: Self-pay

## 2022-12-25 ENCOUNTER — Ambulatory Visit (HOSPITAL_COMMUNITY)
Admission: EM | Admit: 2022-12-25 | Discharge: 2022-12-25 | Disposition: A | Discharge status: Home / Self Care | Payer: No Typology Code available for payment source | Attending: Emergency Medicine | Admitting: Emergency Medicine

## 2022-12-25 DIAGNOSIS — K0889 Other specified disorders of teeth and supporting structures: Secondary | ICD-10-CM | POA: Diagnosis not present

## 2022-12-25 DIAGNOSIS — K047 Periapical abscess without sinus: Secondary | ICD-10-CM

## 2022-12-25 MED ORDER — AMOXICILLIN-POT CLAVULANATE 875-125 MG PO TABS: 1.0000 | ORAL_TABLET | Freq: Two times a day (BID) | ORAL | 0 refills | Status: AC

## 2022-12-25 NOTE — Discharge Instructions (Addendum)
Please take medication as prescribed. Take with food to avoid upset stomach. Continue pain control with tylenol/ibuprofen   Follow with dentist as soon as able  Monitor for any worsening of symptoms.

## 2022-12-25 NOTE — ED Provider Notes (Signed)
[facility]    CSN: [id number] Arrival date & time: 12/25/22  1156      History   Chief Complaint Chief Complaint  Patient presents with   Dental Problem    HPI Zachary Mack is a 42 y.o. male.  Here with 5 to 6-day history of dental pain.  Located right lower molar.  Worsening over the last few days especially with eating.  He has history of cracked molar about 8 months ago. Has been using Tylenol/ibuprofen with temporary relief. No fever or trouble swallowing.  No shortness of breath.  No facial swelling  Last time he saw dentist they told him he had all 4 wisdom teeth growing in.  Has plans to establish with another soon as he just got insurance.  Past Medical History:  Diagnosis Date   BRCA2 gene mutation positive in male 11/11/2020   Depression    Family history of breast cancer 10/23/2020   Family history of malignant neoplasm of digestive organ 10/23/2020   Family history of prostate cancer 10/23/2020   Family history of skin cancer 10/23/2020   Kidney stone     Patient Active Problem List   Diagnosis Date Noted   BRCA2 gene mutation positive in male 11/11/2020   Genetic testing 11/11/2020   Family history of breast cancer 10/23/2020   Family history of prostate cancer 10/23/2020   Family history of malignant neoplasm of digestive organ 10/23/2020   Family history of skin cancer 10/23/2020   Family history of BRCA2 gene positive 10/23/2020    Past Surgical History:  Procedure Laterality Date   EXTRACORPOREAL SHOCK WAVE LITHOTRIPSY Left 11/04/2022   Procedure: EXTRACORPOREAL SHOCK WAVE LITHOTRIPSY (ESWL);  Surgeon: Noel Christmas, MD;  Location: [facility];  Service: Urology;  Laterality: Left;   SKIN GRAFT     age 89       Home Medications    Prior to Admission medications   Medication Sig Start Date End Date Taking? Authorizing Provider  amoxicillin-clavulanate (AUGMENTIN) 875-125 MG tablet Take 1 tablet by mouth 2  (two) times daily for 7 days. 12/25/22 01/01/23 Yes Jamarkis Branam, Lurena Joiner, PA-C    Family History Family History  Problem Relation Age of Onset   Other Mother        BRCA2 mutation   Prostate cancer Father        dx 61s   Breast cancer Maternal Aunt 46   Skin cancer Paternal Grandfather        scalp   Colon cancer Other        MGM's sister, dx after 49   Breast cancer Maternal Aunt 37   Breast cancer Other        MGF's sister; dx 29s   Breast cancer Other        MGM's mother; dx early 62s   Stomach cancer Other        MGM's father; dx early 70s    Social History Social History   Tobacco Use   Smoking status: Never   Smokeless tobacco: Never  Vaping Use   Vaping Use: Never used  Substance Use Topics   Alcohol use: No   Drug use: Yes    Types: Marijuana    Comment: Daily     Allergies   Patient has no known allergies.   Review of Systems Review of Systems As per HPI  Physical Exam Triage Vital Signs ED Triage Vitals [12/25/22 1300]  Enc Vitals Group  BP 128/75     Pulse Rate 74     Resp 18     Temp 98 F (36.7 C)     Temp Source Oral     SpO2 98 %     Weight      Height      Head Circumference      Peak Flow      Pain Score      Pain Loc      Pain Edu?      Excl. in GC?    No data found.  Updated Vital Signs BP 128/75 (BP Location: Left Arm)   Pulse 74   Temp 98 F (36.7 C) (Oral)   Resp 18   SpO2 98%   Physical Exam Vitals and nursing note reviewed.  Constitutional:      General: He is not in acute distress. HENT:     Head:     Comments: No facial swelling. No swelling under jaw.     Mouth/Throat:     Mouth: No oral lesions or angioedema.     Dentition: Abnormal dentition. Dental tenderness and dental caries present. No gingival swelling or gum lesions.     Pharynx: Oropharynx is clear. No pharyngeal swelling.      Comments: Cracked, rotted molars bilat. Tenderness over right side. No obvious gum swelling or fluctuance.  Neck:      Trachea: Trachea and phonation normal.  Cardiovascular:     Rate and Rhythm: Normal rate and regular rhythm.     Heart sounds: Normal heart sounds.  Pulmonary:     Effort: Pulmonary effort is normal.     Breath sounds: Normal breath sounds.  Musculoskeletal:     Cervical back: Full passive range of motion without pain and normal range of motion. No edema. Normal range of motion.  Lymphadenopathy:     Cervical: No cervical adenopathy.  Skin:    General: Skin is warm and dry.     Capillary Refill: Capillary refill takes less than 2 seconds.  Neurological:     Mental Status: He is alert and oriented to person, place, and time.    UC Treatments / Results  Labs (all labs ordered are listed, but only abnormal results are displayed) Labs Reviewed - No data to display  EKG   Radiology No results found.  Procedures Procedures (including critical care time)  Medications Ordered in UC Medications - No data to display  Initial Impression / Assessment and Plan / UC Course  I have reviewed the triage vital signs and the nursing notes.  Pertinent labs & imaging results that were available during my care of the patient were reviewed by me and considered in my medical decision making (see chart for details).  Afebrile and well-appearing.  Clear phonation, tolerating secretions. No concern for any airway obstruction or deeper infection at this time.  Cover for odontogenic infection with augmentin BID x 7 days. Continue pain control at home.  Will follow-up with dentist once established.  Provided with list of local dental clinics. Patient agreeable to plan Discussed reasons to be seen in the ED  Final Clinical Impressions(s) / UC Diagnoses   Final diagnoses:  Pain, dental  Dental infection     Discharge Instructions      Please take medication as prescribed. Take with food to avoid upset stomach. Continue pain control with tylenol/ibuprofen   Follow with dentist as soon as  able  Monitor for any worsening of symptoms.  ED Prescriptions     Medication Sig Dispense Auth. Provider   amoxicillin-clavulanate (AUGMENTIN) 875-125 MG tablet Take 1 tablet by mouth 2 (two) times daily for 7 days. 14 tablet Saturnino Liew, Lurena Joiner, PA-C      PDMP not reviewed this encounter.   Rifka Ramey, Lurena Joiner, [place] 12/25/22 1356

## 2022-12-25 NOTE — ED Triage Notes (Addendum)
Pt present to the office for dental pain. Pt plans to follow up with dentist next week. Pt states he has left-side jaw pain.

## 2023-04-18 ENCOUNTER — Ambulatory Visit: Payer: No Typology Code available for payment source

## 2023-04-18 DIAGNOSIS — Z23 Encounter for immunization: Secondary | ICD-10-CM

## 2024-06-17 ENCOUNTER — Telehealth: Admitting: Physician Assistant

## 2024-06-17 DIAGNOSIS — K047 Periapical abscess without sinus: Secondary | ICD-10-CM

## 2024-06-17 MED ORDER — AMOXICILLIN 500 MG PO CAPS: 500.0000 mg | ORAL_CAPSULE | Freq: Three times a day (TID) | ORAL | 0 refills | Status: AC

## 2024-06-17 NOTE — Progress Notes (Signed)
 Virtual Visit Consent   Zachary Mack, you are scheduled for a virtual visit with a [facility] provider today. Just as with appointments in the office, your consent must be obtained to participate. Your consent will be active for this visit and any virtual visit you may have with one of our providers in the next 365 days. If you have a MyChart account, a copy of this consent can be sent to you electronically.  As this is a virtual visit, video technology does not allow for your provider to perform a traditional examination. This may limit your provider's ability to fully assess your condition. If your provider identifies any concerns that need to be evaluated in person or the need to arrange testing (such as labs, EKG, etc.), we will make arrangements to do so. Although advances in technology are sophisticated, we cannot ensure that it will always work on either your end or our end. If the connection with a video visit is poor, the visit may have to be switched to a telephone visit. With either a video or telephone visit, we are not always able to ensure that we have a secure connection.  By engaging in this virtual visit, you consent to the provision of healthcare and authorize for your insurance to be billed (if applicable) for the services provided during this visit. Depending on your insurance coverage, you may receive a charge related to this service.  I need to obtain your verbal consent now. Are you willing to proceed with your visit today? Zachary Mack has provided verbal consent on 06/17/2024 for a virtual visit (video or telephone). Zachary Borg, PA-C  Date: 06/17/2024 7:10 PM   Virtual Visit via Video Note   I, Zachary Mack, connected with  Zachary Mack  ([id number], 1981-06-14) on 06/17/24 at  7:00 PM EST by a video-enabled telemedicine application and verified that I am speaking with the correct person using two identifiers.  Location: Patient: Virtual Visit Location  Patient: Home Provider: Virtual Visit Location Provider: Home Office   I discussed the limitations of evaluation and management by telemedicine and the availability of in person appointments. The patient expressed understanding and agreed to proceed.    History of Present Illness: Zachary Mack is a 43 y.o. who identifies as a male who was assigned male at birth, and is being seen today for broken tooth x few months, but now infected.  HPI: 42y/o M presents for a telehealth video visit for c/o  infected tooth x few days. Chronic broken tooth, but now increased swelling in the same area.     Problems:  Patient Active Problem List   Diagnosis Date Noted   BRCA2 gene mutation positive in male 11/11/2020   Genetic testing 11/11/2020   Family history of breast cancer 10/23/2020   Family history of prostate cancer 10/23/2020   Family history of malignant neoplasm of digestive organ 10/23/2020   Family history of skin cancer 10/23/2020   Family history of BRCA2 gene positive 10/23/2020    Allergies: No Known Allergies Medications:  Current Outpatient Medications:    amoxicillin  (AMOXIL ) 500 MG capsule, Take 1 capsule (500 mg total) by mouth 3 (three) times daily for 10 days., Disp: 30 capsule, Rfl: 0  Observations/Objective: Patient is well-developed, well-nourished in no acute distress.  Resting comfortably  at home.  Head is normocephalic, atraumatic.  No labored breathing.  Speech is clear and coherent with logical content.  Patient is alert and oriented at baseline.  Assessment and Plan: 1. Dental infection (Primary) - amoxicillin  (AMOXIL ) 500 MG capsule; Take 1 capsule (500 mg total) by mouth 3 (three) times daily for 10 days.  Dispense: 30 capsule; Refill: 0  Start medicine as prescribed. Continue to watch for worsening symptoms. Follow up with dentist once scheduled  Schedule a virtual appointment or follow up at an urgent care clinic if symptoms don't improve.  Pt  verbalized understanding and in agreement.    Follow Up Instructions: I discussed the assessment and treatment plan with the patient. The patient was provided an opportunity to ask questions and all were answered. The patient agreed with the plan and demonstrated an understanding of the instructions.  A copy of instructions were sent to the patient via MyChart unless otherwise noted below.   Patient has requested to receive PHI (AVS, Work Notes, etc) pertaining to this video visit through e-mail as they are currently without active MyChart. They have voiced understand that email is not considered secure and their health information could be viewed by someone other than the patient.   The patient was advised to call back or seek an in-person evaluation if the symptoms worsen or if the condition fails to improve as anticipated.    Zachary Kozma, PA-C

## 2024-06-17 NOTE — Patient Instructions (Signed)
  Zachary Mack, thank you for joining Lovette Borg, PA-C for today's virtual visit.  While this provider is not your primary care provider (PCP), if your PCP is located in our provider database this encounter information will be shared with them immediately following your visit.   A [facility] MyChart account gives you access to today's visit and all your visits, tests, and labs performed at [facility]  click here if you don't have a [facility] MyChart account or go to [email or web address]  Consent: (Patient) Zachary Mack provided verbal consent for this virtual visit at the beginning of the encounter.  Current Medications:  Current Outpatient Medications:    amoxicillin  (AMOXIL ) 500 MG capsule, Take 1 capsule (500 mg total) by mouth 3 (three) times daily for 10 days., Disp: 30 capsule, Rfl: 0   Medications ordered in this encounter:  Meds ordered this encounter  Medications   amoxicillin  (AMOXIL ) 500 MG capsule    Sig: Take 1 capsule (500 mg total) by mouth 3 (three) times daily for 10 days.    Dispense:  30 capsule    Refill:  0    Supervising Provider:   BLAISE ALEENE KIDD [[id number]]     *If you need refills on other medications prior to your next appointment, please contact your pharmacy*  Follow-Up: Call back or seek an in-person evaluation if the symptoms worsen or if the condition fails to improve as anticipated.  [facility] [phone]  Other Instructions Start medicine as prescribed. Continue to watch for worsening symptoms. Follow up with dentist once scheduled  Schedule a virtual appointment or follow up at an urgent care clinic if symptoms don't improve.    If you have been instructed to have an in-person evaluation today at a local Urgent Care facility, please use the link below. It will take you to a list of all of our available [facility], including address, phone number and hours of operation. Please do not  delay care.  [facility]  If you or a family member do not have a primary care provider, use the link below to schedule a visit and establish care. When you choose a [facility] primary care physician or advanced practice provider, you gain a long-term partner in health. Find a Primary Care Provider  Learn more about [facility]'s in-office and virtual care options: [facility] - Get Care Now
